# Patient Record
Sex: Female | Born: 1990 | Race: White | Hispanic: No | Marital: Married | Smoking: Never smoker
Health system: Southern US, Community
[De-identification: ages and names within clinical notes are randomized; demographics above are authoritative.]

## PROBLEM LIST (undated history)

## (undated) DIAGNOSIS — E669 Obesity, unspecified: Secondary | ICD-10-CM

## (undated) HISTORY — DX: Obesity, unspecified: E66.9

---
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
## 2011-10-17 LAB — OB RESULTS CONSOLE HIV ANTIBODY (ROUTINE TESTING): HIV: NONREACTIVE

## 2011-10-17 LAB — OB RESULTS CONSOLE GC/CHLAMYDIA: Chlamydia: NEGATIVE

## 2011-10-17 LAB — OB RESULTS CONSOLE HEPATITIS B SURFACE ANTIGEN: Hepatitis B Surface Ag: NEGATIVE

## 2011-10-17 LAB — OB RESULTS CONSOLE RPR: RPR: NONREACTIVE

## 2012-05-01 ENCOUNTER — Inpatient Hospital Stay (HOSPITAL_COMMUNITY): Admission: AD | Admit: 2012-05-01 | Payer: Self-pay | Source: Ambulatory Visit | Admitting: Obstetrics & Gynecology

## 2012-05-07 ENCOUNTER — Inpatient Hospital Stay (HOSPITAL_COMMUNITY)
Admission: AD | Admit: 2012-05-07 | Discharge: 2012-05-10 | DRG: 373 | Disposition: A | Discharge status: Home / Self Care | Payer: BC Managed Care – PPO | Source: Ambulatory Visit | Attending: Obstetrics and Gynecology | Admitting: Obstetrics and Gynecology

## 2012-05-07 DIAGNOSIS — O34219 Maternal care for unspecified type scar from previous cesarean delivery: Principal | ICD-10-CM | POA: Diagnosis present

## 2012-05-07 NOTE — L&D Delivery Note (Signed)
Patient was C/C/+1 and pushed for aprrox 60 minutes with epidural.   NSVD  female infant, Apgars 8/9, weight 7#13.   The patient had bilateral vaginal wall mucosal  lacerations repaired with 3-0 vicryl. Fundus was firm. EBL was expected. Placenta was delivered intact. Vagina was clear.  Baby was vigorous to bedside.  Philip Aspen

## 2012-05-08 ENCOUNTER — Encounter (HOSPITAL_COMMUNITY): Payer: Self-pay | Admitting: *Deleted

## 2012-05-08 ENCOUNTER — Inpatient Hospital Stay (HOSPITAL_COMMUNITY): Payer: BC Managed Care – PPO | Admitting: Anesthesiology

## 2012-05-08 ENCOUNTER — Inpatient Hospital Stay (HOSPITAL_COMMUNITY): Admission: RE | Admit: 2012-05-08 | Payer: 59 | Source: Ambulatory Visit

## 2012-05-08 ENCOUNTER — Encounter (HOSPITAL_COMMUNITY): Payer: Self-pay | Admitting: Anesthesiology

## 2012-05-08 LAB — RPR: RPR Ser Ql: NONREACTIVE

## 2012-05-08 LAB — CBC
HCT: 36.1 % (ref 36.0–46.0)
Hemoglobin: 12.4 g/dL (ref 12.0–15.0)
MCH: 30.6 pg (ref 26.0–34.0)
RBC: 4.05 MIL/uL (ref 3.87–5.11)

## 2012-05-08 LAB — ABO/RH: ABO/RH(D): A POS

## 2012-05-08 MED ORDER — LACTATED RINGERS IV SOLN: 500.0000 mL | INTRAVENOUS | Status: DC | PRN

## 2012-05-08 MED ORDER — DIPHENHYDRAMINE HCL 25 MG PO CAPS: 25.0000 mg | ORAL_CAPSULE | Freq: Four times a day (QID) | ORAL | Status: DC | PRN

## 2012-05-08 MED ORDER — EPHEDRINE 5 MG/ML INJ: 10.0000 mg | INTRAVENOUS | Status: DC | PRN

## 2012-05-08 MED ORDER — LANOLIN HYDROUS EX OINT: TOPICAL_OINTMENT | CUTANEOUS | Status: DC | PRN

## 2012-05-08 MED ORDER — PRENATAL MULTIVITAMIN CH
1.0000 | ORAL_TABLET | Freq: Every day | ORAL | Status: DC
  Filled 2012-05-08 (×2): qty 1

## 2012-05-08 MED ORDER — LIDOCAINE HCL (PF) 1 % IJ SOLN
INTRAMUSCULAR | Status: DC | PRN
  Administered 2012-05-08 (×2): 5 mL

## 2012-05-08 MED ORDER — OXYTOCIN 40 UNITS IN LACTATED RINGERS INFUSION - SIMPLE MED
1.0000 m[IU]/min | INTRAVENOUS | Status: DC
  Administered 2012-05-08: 2 m[IU]/min via INTRAVENOUS

## 2012-05-08 MED ORDER — EPHEDRINE 5 MG/ML INJ
10.0000 mg | INTRAVENOUS | Status: DC | PRN
  Filled 2012-05-08: qty 4

## 2012-05-08 MED ORDER — FENTANYL 2.5 MCG/ML BUPIVACAINE 1/10 % EPIDURAL INFUSION (WH - ANES)
14.0000 mL/h | INTRAMUSCULAR | Status: DC
  Administered 2012-05-08 (×2): 14 mL/h via EPIDURAL
  Filled 2012-05-08 (×2): qty 125

## 2012-05-08 MED ORDER — ONDANSETRON HCL 4 MG PO TABS: 4.0000 mg | ORAL_TABLET | ORAL | Status: DC | PRN

## 2012-05-08 MED ORDER — TERBUTALINE SULFATE 1 MG/ML IJ SOLN: 0.2500 mg | Freq: Once | INTRAMUSCULAR | Status: DC | PRN

## 2012-05-08 MED ORDER — PHENYLEPHRINE 40 MCG/ML (10ML) SYRINGE FOR IV PUSH (FOR BLOOD PRESSURE SUPPORT)
80.0000 ug | PREFILLED_SYRINGE | INTRAVENOUS | Status: DC | PRN
  Filled 2012-05-08: qty 5

## 2012-05-08 MED ORDER — IBUPROFEN 600 MG PO TABS: 600.0000 mg | ORAL_TABLET | Freq: Four times a day (QID) | ORAL | Status: DC | PRN

## 2012-05-08 MED ORDER — OXYTOCIN 40 UNITS IN LACTATED RINGERS INFUSION - SIMPLE MED
62.5000 mL/h | INTRAVENOUS | Status: DC
  Administered 2012-05-08: 62.5 mL/h via INTRAVENOUS
  Filled 2012-05-08: qty 1000

## 2012-05-08 MED ORDER — IBUPROFEN 600 MG PO TABS
600.0000 mg | ORAL_TABLET | Freq: Four times a day (QID) | ORAL | Status: DC
  Administered 2012-05-09 – 2012-05-10 (×7): 600 mg via ORAL
  Filled 2012-05-08 (×7): qty 1

## 2012-05-08 MED ORDER — LIDOCAINE HCL (PF) 1 % IJ SOLN: 30.0000 mL | INTRAMUSCULAR | Status: DC | PRN

## 2012-05-08 MED ORDER — ONDANSETRON HCL 4 MG/2ML IJ SOLN: 4.0000 mg | Freq: Four times a day (QID) | INTRAMUSCULAR | Status: DC | PRN

## 2012-05-08 MED ORDER — BENZOCAINE-MENTHOL 20-0.5 % EX AERO
1.0000 "application " | INHALATION_SPRAY | CUTANEOUS | Status: DC | PRN
  Filled 2012-05-08: qty 56

## 2012-05-08 MED ORDER — PHENYLEPHRINE 40 MCG/ML (10ML) SYRINGE FOR IV PUSH (FOR BLOOD PRESSURE SUPPORT): 80.0000 ug | PREFILLED_SYRINGE | INTRAVENOUS | Status: DC | PRN

## 2012-05-08 MED ORDER — WITCH HAZEL-GLYCERIN EX PADS: 1.0000 "application " | MEDICATED_PAD | CUTANEOUS | Status: DC | PRN

## 2012-05-08 MED ORDER — LACTATED RINGERS IV SOLN: 500.0000 mL | Freq: Once | INTRAVENOUS | Status: DC

## 2012-05-08 MED ORDER — SIMETHICONE 80 MG PO CHEW: 80.0000 mg | CHEWABLE_TABLET | ORAL | Status: DC | PRN

## 2012-05-08 MED ORDER — LACTATED RINGERS IV SOLN
INTRAVENOUS | Status: DC
  Administered 2012-05-08: 11:00:00 via INTRAVENOUS

## 2012-05-08 MED ORDER — OXYCODONE-ACETAMINOPHEN 5-325 MG PO TABS: 1.0000 | ORAL_TABLET | ORAL | Status: DC | PRN

## 2012-05-08 MED ORDER — FLEET ENEMA 7-19 GM/118ML RE ENEM: 1.0000 | ENEMA | RECTAL | Status: DC | PRN

## 2012-05-08 MED ORDER — OXYCODONE-ACETAMINOPHEN 5-325 MG PO TABS
1.0000 | ORAL_TABLET | ORAL | Status: DC | PRN
  Administered 2012-05-08 – 2012-05-09 (×3): 1 via ORAL
  Filled 2012-05-08 (×3): qty 1

## 2012-05-08 MED ORDER — DIBUCAINE 1 % RE OINT: 1.0000 "application " | TOPICAL_OINTMENT | RECTAL | Status: DC | PRN

## 2012-05-08 MED ORDER — ACETAMINOPHEN 325 MG PO TABS
650.0000 mg | ORAL_TABLET | ORAL | Status: DC | PRN
  Administered 2012-05-08: 650 mg via ORAL
  Filled 2012-05-08: qty 2

## 2012-05-08 MED ORDER — TETANUS-DIPHTH-ACELL PERTUSSIS 5-2.5-18.5 LF-MCG/0.5 IM SUSP: 0.5000 mL | Freq: Once | INTRAMUSCULAR | Status: DC

## 2012-05-08 MED ORDER — CITRIC ACID-SODIUM CITRATE 334-500 MG/5ML PO SOLN: 30.0000 mL | ORAL | Status: DC | PRN

## 2012-05-08 MED ORDER — SENNOSIDES-DOCUSATE SODIUM 8.6-50 MG PO TABS
2.0000 | ORAL_TABLET | Freq: Every day | ORAL | Status: DC
  Administered 2012-05-08: 2 via ORAL

## 2012-05-08 MED ORDER — OXYTOCIN BOLUS FROM INFUSION
500.0000 mL | INTRAVENOUS | Status: DC
  Administered 2012-05-08: 500 mL via INTRAVENOUS

## 2012-05-08 MED ORDER — ONDANSETRON HCL 4 MG/2ML IJ SOLN: 4.0000 mg | INTRAMUSCULAR | Status: DC | PRN

## 2012-05-08 MED ORDER — ZOLPIDEM TARTRATE 5 MG PO TABS: 5.0000 mg | ORAL_TABLET | Freq: Every evening | ORAL | Status: DC | PRN

## 2012-05-08 MED ORDER — DIPHENHYDRAMINE HCL 50 MG/ML IJ SOLN: 12.5000 mg | INTRAMUSCULAR | Status: DC | PRN

## 2012-05-08 NOTE — MAU Note (Signed)
ARRIVED-  BROUGHT TO RM 7 VIA W/C.  THEN ASKED IF COULD VOID.

## 2012-05-08 NOTE — Anesthesia Preprocedure Evaluation (Signed)
Anesthesia Evaluation  Patient identified by MRN, date of birth, ID band Patient awake    Reviewed: Allergy & Precautions, H&P , Patient's Chart, lab work & pertinent test results  Airway Mallampati: III TM Distance: >3 FB Neck ROM: full    Dental No notable dental hx.    Pulmonary neg pulmonary ROS,  breath sounds clear to auscultation  Pulmonary exam normal       Cardiovascular negative cardio ROS  Rhythm:regular Rate:Normal     Neuro/Psych negative neurological ROS  negative psych ROS   GI/Hepatic negative GI ROS, Neg liver ROS,   Endo/Other  negative endocrine ROS  Renal/GU negative Renal ROS     Musculoskeletal   Abdominal   Peds  Hematology negative hematology ROS (+)   Anesthesia Other Findings   Reproductive/Obstetrics (+) Pregnancy                           Anesthesia Physical Anesthesia Plan  ASA: III  Anesthesia Plan: Epidural   Post-op Pain Management:    Induction:   Airway Management Planned:   Additional Equipment:   Intra-op Plan:   Post-operative Plan:   Informed Consent: I have reviewed the patients History and Physical, chart, labs and discussed the procedure including the risks, benefits and alternatives for the proposed anesthesia with the patient or authorized representative who has indicated his/her understanding and acceptance.     Plan Discussed with:   Anesthesia Plan Comments:         Anesthesia Quick Evaluation  

## 2012-05-08 NOTE — MAU Note (Signed)
SAYS SHE  HURT BAD  SINCE 10PM. .  SAYS WAS 2 CM IN OFFICE YESTERDAY.  DENIES HSV AND MRSA.   C/S  WITH FIRST PREG- BREECH-   TRY TO VBAC THIS PREG

## 2012-05-08 NOTE — Progress Notes (Signed)
Dr Claiborne Billings at nurse's station and updated on decel, interventions and sve

## 2012-05-08 NOTE — H&P (Signed)
22 y.o. [redacted]w[redacted]d  G2P1001 comes in c/o ctx.  Otherwise has good fetal movement and no bleeding.  H/o c/s for breech, desires TOLAC, consent obtained after review of risks, benefits, complications.  History reviewed. No pertinent past medical history.  Past Surgical History  Procedure Date  . Cesarean section     OB History    Grav Para Term Preterm Abortions TAB SAB Ect Mult Living   2 1 1       1      # Outc Date GA Lbr Len/2nd Wgt Sex Del Anes PTL Lv   1 TRM            2 CUR               History   Social History  . Marital Status: Married    Spouse Name: N/A    Number of Children: N/A  . Years of Education: N/A   Occupational History  . Not on file.   Social History Main Topics  . Smoking status: Never Smoker   . Smokeless tobacco: Not on file  . Alcohol Use: No  . Drug Use: No  . Sexually Active:    Other Topics Concern  . Not on file   Social History Narrative  . No narrative on file   Latex and Penicillins    Prenatal Transfer Tool  Maternal Diabetes: No Genetic Screening: Declined Fetal Ultrasounds or other Referrals:  Other: nl anatomy scan Maternal Substance Abuse:  No Significant Maternal Medications:  None Significant Maternal Lab Results: None  Other PNC: none    Filed Vitals:   05/08/12 0931  BP: 82/56  Pulse: 119  Temp:   Resp: 16     Lungs/Cor:  NAD Abdomen:  soft, gravid Ex:  no cords, erythema SVE:  5.5/80/-1 - no change from prior exam FHTs:  145, good STV, NST R Toco:  q2-5   A/P   Pt admitted for labor, TOLAC AROM'd for clear fluid, will start pit, IUPC placed  GBS  Neg  Takasha Vetere

## 2012-05-08 NOTE — Anesthesia Procedure Notes (Signed)
Epidural Patient location during procedure: OB Start time: 05/08/2012 3:31 AM  Staffing Anesthesiologist: Angus Seller., Harrell Gave. Performed by: anesthesiologist   Preanesthetic Checklist Completed: patient identified, site marked, surgical consent, pre-op evaluation, timeout performed, IV checked, risks and benefits discussed and monitors and equipment checked  Epidural Patient position: sitting Prep: site prepped and draped and DuraPrep Patient monitoring: continuous pulse ox and blood pressure Approach: midline Injection technique: LOR air and LOR saline  Needle:  Needle type: Tuohy  Needle gauge: 17 G Needle length: 9 cm and 9 Needle insertion depth: 6 cm Catheter type: closed end flexible Catheter size: 19 Gauge Catheter at skin depth: 11 cm Test dose: negative  Assessment Events: blood not aspirated, injection not painful, no injection resistance, negative IV test and no paresthesia  Additional Notes Patient identified.  Risk benefits discussed including failed block, incomplete pain control, headache, nerve damage, paralysis, blood pressure changes, nausea, vomiting, reactions to medication both toxic or allergic, and postpartum back pain.  Patient expressed understanding and wished to proceed.  All questions were answered.  Sterile technique used throughout procedure and epidural site dressed with sterile barrier dressing. No paresthesia or other complications noted.The patient did not experience any signs of intravascular injection such as tinnitus or metallic taste in mouth nor signs of intrathecal spread such as rapid motor block. Please see nursing notes for vital signs.

## 2012-05-08 NOTE — Progress Notes (Signed)
Dr Claiborne Billings notified pt complete

## 2012-05-08 NOTE — Progress Notes (Signed)
Dr Claiborne Billings notified of variables lasting approx 60-80 seconds with pushing

## 2012-05-09 LAB — CBC
Hemoglobin: 10.4 g/dL — ABNORMAL LOW (ref 12.0–15.0)
MCH: 30.1 pg (ref 26.0–34.0)
MCHC: 33.3 g/dL (ref 30.0–36.0)
Platelets: 136 10*3/uL — ABNORMAL LOW (ref 150–400)
RBC: 3.45 MIL/uL — ABNORMAL LOW (ref 3.87–5.11)

## 2012-05-09 NOTE — Anesthesia Postprocedure Evaluation (Signed)
Anesthesia Post Note  Patient: [name]  Procedure(s) Performed: * No procedures listed *  Anesthesia type: Epidural  Patient location: Mother/Baby  Post pain: Pain level controlled  Post assessment: Post-op Vital signs reviewed  Last Vitals:  Filed Vitals:   05/09/12 0610  BP: 106/62  Pulse: 105  Temp: 36.6 C  Resp: 18    Post vital signs: Reviewed  Level of consciousness:alert  Complications: No apparent anesthesia complications

## 2012-05-09 NOTE — Addendum Note (Signed)
Addendum  created 05/09/12 1725 by Algis Greenhouse, CRNA   Modules edited:Anesthesia LDA

## 2012-05-09 NOTE — Progress Notes (Signed)
Pt is 22 yo and desires BTL.  I d/w the pt the high rate of regret in her age group for permanent sterilization and the options for reversible BC.  I d/w the pt that the best plan of action was to have her BTL at 6 weeks pp to make sure everything is ok with this baby and to give a little more time to consider.  She agreed.

## 2012-05-09 NOTE — Discharge Summary (Signed)
Obstetric Discharge Summary Reason for Admission: onset of labor Prenatal Procedures: none Intrapartum Procedures: spontaneous vaginal delivery, VBAC Postpartum Procedures: none Complications-Operative and Postpartum: 1 st degree vaginal lacerations Hemoglobin  Date Value Range Status  05/09/2012 10.4* 12.0 - 15.0 g/dL Final     HCT  Date Value Range Status  05/09/2012 31.2* 36.0 - 46.0 % Final    Discharge Diagnoses: Term Pregnancy-delivered  Discharge Information: Date: 05/09/2012 Activity: pelvic rest Diet: routine Medications: ibuprofen Condition: stable Instructions: refer to practice specific booklet Discharge to: home Follow-up Information    Follow up with CALLAHAN, SIDNEY, DO. Schedule an appointment as soon as possible for a visit in 4 weeks.   Contact information:   [address] [place] [address] [phone]          Newborn Data: Live born female  Birth Weight: 7 lb 13.8 oz (3566 g) APGAR: 8, 9  Home with mother.  Pleasant Britz A 05/09/2012, 7:56 AM

## 2012-05-09 NOTE — Progress Notes (Signed)
Patient is eating, ambulating, voiding.  Pain control is good.  Filed Vitals:   05/08/12 2036 05/08/12 2128 05/09/12 0046 05/09/12 0610  BP: 135/69 124/69 139/65 106/62  Pulse: 122 123 118 105  Temp: 98.3 F (36.8 C) 98 F (36.7 C) 98.1 F (36.7 C) 97.9 F (36.6 C)  TempSrc: Oral Oral Oral Oral  Resp: 16 18 18 18   Height:      Weight:      SpO2:   99%     Fundus firm Perineum without swelling.  Lab Results  Component Value Date   WBC 17.2* 05/09/2012   HGB 10.4* 05/09/2012   HCT 31.2* 05/09/2012   MCV 90.4 05/09/2012   PLT 136* 05/09/2012    --/--/A POS (01/02 0205)/RI  A/P Post partum day 1.  Routine care.  Expect d/c tomorrow.    Theresia Pree A

## 2012-05-10 LAB — TYPE AND SCREEN
Antibody Screen: NEGATIVE
Unit division: 0
Unit division: 0

## 2012-05-10 NOTE — Progress Notes (Signed)
Patient is eating, ambulating, voiding.  Pain control is good.  Filed Vitals:   05/09/12 0610 05/09/12 1415 05/09/12 2153 05/10/12 0625  BP: 106/62 115/67 107/68 116/57  Pulse: 105 98 96 83  Temp: 97.9 F (36.6 C) 98 F (36.7 C) 97.7 F (36.5 C) 97.2 F (36.2 C)  TempSrc: Oral Oral Oral Oral  Resp: 18 18 18 16   Height:      Weight:      SpO2:        Fundus firm Perineum without swelling.  Lab Results  Component Value Date   WBC 17.2* 05/09/2012   HGB 10.4* 05/09/2012   HCT 31.2* 05/09/2012   MCV 90.4 05/09/2012   PLT 136* 05/09/2012    --/--/A POS (01/02 0205)/RI  A/P Post partum day 2.  Routine care.  Expect d/c today.    Cedar Ditullio A

## 2012-06-22 ENCOUNTER — Observation Stay (HOSPITAL_COMMUNITY): Payer: BC Managed Care – PPO | Admitting: Anesthesiology

## 2012-06-22 ENCOUNTER — Observation Stay (HOSPITAL_COMMUNITY): Payer: BC Managed Care – PPO

## 2012-06-22 ENCOUNTER — Encounter (HOSPITAL_COMMUNITY): Payer: Self-pay

## 2012-06-22 ENCOUNTER — Encounter (HOSPITAL_COMMUNITY): Payer: Self-pay | Admitting: Anesthesiology

## 2012-06-22 ENCOUNTER — Inpatient Hospital Stay (HOSPITAL_COMMUNITY)
Admission: EM | Admit: 2012-06-22 | Discharge: 2012-06-24 | DRG: 494 | Disposition: A | Discharge status: Home / Self Care | Payer: BC Managed Care – PPO | Attending: Surgery | Admitting: Surgery

## 2012-06-22 ENCOUNTER — Encounter (HOSPITAL_COMMUNITY): Admission: EM | Disposition: A | Discharge status: Home / Self Care | Payer: Self-pay | Source: Home / Self Care

## 2012-06-22 ENCOUNTER — Emergency Department (HOSPITAL_COMMUNITY): Payer: BC Managed Care – PPO

## 2012-06-22 DIAGNOSIS — K8000 Calculus of gallbladder with acute cholecystitis without obstruction: Principal | ICD-10-CM | POA: Diagnosis present

## 2012-06-22 DIAGNOSIS — K8010 Calculus of gallbladder with chronic cholecystitis without obstruction: Secondary | ICD-10-CM

## 2012-06-22 DIAGNOSIS — K810 Acute cholecystitis: Secondary | ICD-10-CM | POA: Diagnosis present

## 2012-06-22 DIAGNOSIS — K8050 Calculus of bile duct without cholangitis or cholecystitis without obstruction: Secondary | ICD-10-CM

## 2012-06-22 DIAGNOSIS — Z880 Allergy status to penicillin: Secondary | ICD-10-CM

## 2012-06-22 DIAGNOSIS — Z91040 Latex allergy status: Secondary | ICD-10-CM

## 2012-06-22 HISTORY — PX: CHOLECYSTECTOMY: SHX55

## 2012-06-22 LAB — COMPREHENSIVE METABOLIC PANEL
ALT: 17 U/L (ref 0–35)
AST: 16 U/L (ref 0–37)
Albumin: 4.4 g/dL (ref 3.5–5.2)
Alkaline Phosphatase: 122 U/L — ABNORMAL HIGH (ref 39–117)
BUN: 12 mg/dL (ref 6–23)
Chloride: 101 mEq/L (ref 96–112)
Potassium: 3.5 mEq/L (ref 3.5–5.1)
Sodium: 139 mEq/L (ref 135–145)
Total Protein: 8.5 g/dL — ABNORMAL HIGH (ref 6.0–8.3)

## 2012-06-22 LAB — CBC WITH DIFFERENTIAL/PLATELET
Basophils Absolute: 0 10*3/uL (ref 0.0–0.1)
Basophils Relative: 0 % (ref 0–1)
Eosinophils Absolute: 0.1 10*3/uL (ref 0.0–0.7)
MCH: 30 pg (ref 26.0–34.0)
MCHC: 34.5 g/dL (ref 30.0–36.0)
Neutro Abs: 9.6 10*3/uL — ABNORMAL HIGH (ref 1.7–7.7)
Neutrophils Relative %: 73 % (ref 43–77)
Platelets: 238 10*3/uL (ref 150–400)
RDW: 12.3 % (ref 11.5–15.5)

## 2012-06-22 LAB — URINALYSIS, ROUTINE W REFLEX MICROSCOPIC
Glucose, UA: NEGATIVE mg/dL
Ketones, ur: NEGATIVE mg/dL
Nitrite: NEGATIVE
Protein, ur: NEGATIVE mg/dL

## 2012-06-22 LAB — MRSA PCR SCREENING: MRSA by PCR: NEGATIVE

## 2012-06-22 LAB — PREGNANCY, URINE: Preg Test, Ur: NEGATIVE

## 2012-06-22 LAB — LIPASE, BLOOD: Lipase: 25 U/L (ref 11–59)

## 2012-06-22 SURGERY — LAPAROSCOPIC CHOLECYSTECTOMY WITH INTRAOPERATIVE CHOLANGIOGRAM
Anesthesia: General | Site: Abdomen | Wound class: Clean Contaminated

## 2012-06-22 MED ORDER — ONDANSETRON HCL 4 MG/2ML IJ SOLN
INTRAMUSCULAR | Status: DC | PRN
  Administered 2012-06-22: 4 mg via INTRAVENOUS

## 2012-06-22 MED ORDER — FENTANYL CITRATE 0.05 MG/ML IJ SOLN
INTRAMUSCULAR | Status: AC
  Filled 2012-06-22: qty 2

## 2012-06-22 MED ORDER — LACTATED RINGERS IR SOLN
Status: DC | PRN
  Administered 2012-06-22: 1

## 2012-06-22 MED ORDER — KCL IN DEXTROSE-NACL 20-5-0.9 MEQ/L-%-% IV SOLN
INTRAVENOUS | Status: DC
  Administered 2012-06-22 – 2012-06-23 (×3): via INTRAVENOUS
  Administered 2012-06-24: 100 mL/h via INTRAVENOUS
  Filled 2012-06-22 (×5): qty 1000

## 2012-06-22 MED ORDER — KCL IN DEXTROSE-NACL 20-5-0.9 MEQ/L-%-% IV SOLN
INTRAVENOUS | Status: DC
  Administered 2012-06-22: 13:00:00 via INTRAVENOUS
  Filled 2012-06-22 (×2): qty 1000

## 2012-06-22 MED ORDER — MORPHINE SULFATE 4 MG/ML IJ SOLN
4.0000 mg | Freq: Once | INTRAMUSCULAR | Status: AC
  Administered 2012-06-22: 4 mg via INTRAVENOUS
  Filled 2012-06-22: qty 1

## 2012-06-22 MED ORDER — ONDANSETRON HCL 4 MG/2ML IJ SOLN
4.0000 mg | Freq: Once | INTRAMUSCULAR | Status: AC
  Administered 2012-06-22: 4 mg via INTRAVENOUS
  Filled 2012-06-22: qty 2

## 2012-06-22 MED ORDER — ROCURONIUM BROMIDE 100 MG/10ML IV SOLN
INTRAVENOUS | Status: DC | PRN
  Administered 2012-06-22: 20 mg via INTRAVENOUS

## 2012-06-22 MED ORDER — LIDOCAINE HCL (CARDIAC) 20 MG/ML IV SOLN
INTRAVENOUS | Status: DC | PRN
  Administered 2012-06-22: 100 mg via INTRAVENOUS

## 2012-06-22 MED ORDER — IOHEXOL 300 MG/ML  SOLN
INTRAMUSCULAR | Status: AC
  Filled 2012-06-22: qty 1

## 2012-06-22 MED ORDER — GLYCOPYRROLATE 0.2 MG/ML IJ SOLN
INTRAMUSCULAR | Status: DC | PRN
  Administered 2012-06-22: 0.4 mg via INTRAVENOUS

## 2012-06-22 MED ORDER — LACTATED RINGERS IV SOLN
INTRAVENOUS | Status: DC | PRN
  Administered 2012-06-22 (×2): via INTRAVENOUS

## 2012-06-22 MED ORDER — PROPOFOL 10 MG/ML IV BOLUS
INTRAVENOUS | Status: DC | PRN
  Administered 2012-06-22: 150 mg via INTRAVENOUS

## 2012-06-22 MED ORDER — MORPHINE SULFATE 2 MG/ML IJ SOLN
2.0000 mg | INTRAMUSCULAR | Status: DC | PRN
  Administered 2012-06-22: 2 mg via INTRAVENOUS
  Filled 2012-06-22: qty 1

## 2012-06-22 MED ORDER — LACTATED RINGERS IV SOLN: INTRAVENOUS | Status: DC

## 2012-06-22 MED ORDER — MIDAZOLAM HCL 5 MG/5ML IJ SOLN
INTRAMUSCULAR | Status: DC | PRN
  Administered 2012-06-22: 2 mg via INTRAVENOUS

## 2012-06-22 MED ORDER — BUPIVACAINE-EPINEPHRINE 0.25% -1:200000 IJ SOLN
INTRAMUSCULAR | Status: DC | PRN
  Administered 2012-06-22: 20 mL

## 2012-06-22 MED ORDER — SODIUM CHLORIDE 0.9 % IV SOLN
Freq: Once | INTRAVENOUS | Status: AC
  Administered 2012-06-22: 07:00:00 via INTRAVENOUS

## 2012-06-22 MED ORDER — DEXAMETHASONE SODIUM PHOSPHATE 10 MG/ML IJ SOLN
INTRAMUSCULAR | Status: DC | PRN
  Administered 2012-06-22: 10 mg via INTRAVENOUS

## 2012-06-22 MED ORDER — FENTANYL CITRATE 0.05 MG/ML IJ SOLN
INTRAMUSCULAR | Status: DC | PRN
  Administered 2012-06-22 (×2): 50 ug via INTRAVENOUS

## 2012-06-22 MED ORDER — CIPROFLOXACIN IN D5W 400 MG/200ML IV SOLN
400.0000 mg | Freq: Two times a day (BID) | INTRAVENOUS | Status: DC
Start: 2012-06-22 — End: 2012-06-24
  Administered 2012-06-22 – 2012-06-24 (×4): 400 mg via INTRAVENOUS
  Filled 2012-06-22 (×5): qty 200

## 2012-06-22 MED ORDER — IOHEXOL 300 MG/ML  SOLN
INTRAMUSCULAR | Status: DC | PRN
  Administered 2012-06-22: 20 mL via INTRAVENOUS

## 2012-06-22 MED ORDER — ONDANSETRON HCL 4 MG/2ML IJ SOLN: 4.0000 mg | Freq: Four times a day (QID) | INTRAMUSCULAR | Status: DC | PRN

## 2012-06-22 MED ORDER — SUCCINYLCHOLINE CHLORIDE 20 MG/ML IJ SOLN
INTRAMUSCULAR | Status: DC | PRN
  Administered 2012-06-22: 100 mg via INTRAVENOUS

## 2012-06-22 MED ORDER — FENTANYL CITRATE 0.05 MG/ML IJ SOLN
25.0000 ug | INTRAMUSCULAR | Status: DC | PRN
  Administered 2012-06-22 (×3): 50 ug via INTRAVENOUS

## 2012-06-22 MED ORDER — BUPIVACAINE-EPINEPHRINE 0.25% -1:200000 IJ SOLN
INTRAMUSCULAR | Status: AC
  Filled 2012-06-22: qty 1

## 2012-06-22 MED ORDER — MORPHINE SULFATE 2 MG/ML IJ SOLN
2.0000 mg | INTRAMUSCULAR | Status: DC | PRN
  Administered 2012-06-22 – 2012-06-23 (×5): 2 mg via INTRAVENOUS
  Administered 2012-06-23: 4 mg via INTRAVENOUS
  Filled 2012-06-22 (×4): qty 1
  Filled 2012-06-22: qty 2
  Filled 2012-06-22: qty 1

## 2012-06-22 MED ORDER — NEOSTIGMINE METHYLSULFATE 1 MG/ML IJ SOLN
INTRAMUSCULAR | Status: DC | PRN
  Administered 2012-06-22: 3 mg via INTRAVENOUS

## 2012-06-22 SURGICAL SUPPLY — 48 items
APPLIER CLIP ROT 10 11.4 M/L (STAPLE) ×2
BENZOIN TINCTURE PRP APPL 2/3 (GAUZE/BANDAGES/DRESSINGS) IMPLANT
CANISTER SUCTION 2500CC (MISCELLANEOUS) ×2 IMPLANT
CATH REDDICK CHOLANGI 4FR 50CM (CATHETERS) ×2 IMPLANT
CHLORAPREP W/TINT 26ML (MISCELLANEOUS) ×2 IMPLANT
CLIP APPLIE ROT 10 11.4 M/L (STAPLE) ×1 IMPLANT
CLOTH BEACON ORANGE TIMEOUT ST (SAFETY) ×2 IMPLANT
COVER MAYO STAND STRL (DRAPES) ×2 IMPLANT
DECANTER SPIKE VIAL GLASS SM (MISCELLANEOUS) ×2 IMPLANT
DERMABOND ADVANCED (GAUZE/BANDAGES/DRESSINGS) ×2
DERMABOND ADVANCED .7 DNX12 (GAUZE/BANDAGES/DRESSINGS) ×2 IMPLANT
DRAIN CHANNEL RND F F (WOUND CARE) ×2 IMPLANT
DRAPE C-ARM 42X72 X-RAY (DRAPES) ×2 IMPLANT
DRAPE LAPAROSCOPIC ABDOMINAL (DRAPES) ×2 IMPLANT
ELECT REM PT RETURN 9FT ADLT (ELECTROSURGICAL) ×2
ELECTRODE REM PT RTRN 9FT ADLT (ELECTROSURGICAL) ×1 IMPLANT
ENDOLOOP SUT PDS II  0 18 (SUTURE) ×2
ENDOLOOP SUT PDS II 0 18 (SUTURE) ×2 IMPLANT
EVACUATOR SILICONE 100CC (DRAIN) ×2 IMPLANT
GLOVE BIOGEL PI IND STRL 7.0 (GLOVE) ×1 IMPLANT
GLOVE BIOGEL PI INDICATOR 7.0 (GLOVE) ×1
GLOVE SS BIOGEL STRL SZ 7.5 (GLOVE) ×1 IMPLANT
GLOVE SUPERSENSE BIOGEL SZ 7.5 (GLOVE) ×1
GOWN STRL NON-REIN LRG LVL3 (GOWN DISPOSABLE) ×2 IMPLANT
GOWN STRL REIN XL XLG (GOWN DISPOSABLE) ×4 IMPLANT
HEMOSTAT SURGICEL 4X8 (HEMOSTASIS) IMPLANT
IV CATH 14GX2 1/4 (CATHETERS) ×4 IMPLANT
IV SET EXT 30 76VOL 4 MALE LL (IV SETS) IMPLANT
KIT BASIN OR (CUSTOM PROCEDURE TRAY) ×2 IMPLANT
NS IRRIG 1000ML POUR BTL (IV SOLUTION) IMPLANT
POUCH SPECIMEN RETRIEVAL 10MM (ENDOMECHANICALS) ×2 IMPLANT
SET CHOLANGIOGRAPH MIX (MISCELLANEOUS) ×2 IMPLANT
SET IRRIG TUBING LAPAROSCOPIC (IRRIGATION / IRRIGATOR) ×2 IMPLANT
SOLUTION ANTI FOG 6CC (MISCELLANEOUS) ×2 IMPLANT
SPONGE GAUZE 4X4 12PLY (GAUZE/BANDAGES/DRESSINGS) ×2 IMPLANT
STOPCOCK K 69 2C6206 (IV SETS) IMPLANT
STRIP CLOSURE SKIN 1/2X4 (GAUZE/BANDAGES/DRESSINGS) IMPLANT
SUT ETHILON 3 0 PS 1 (SUTURE) ×2 IMPLANT
SUT MNCRL AB 4-0 PS2 18 (SUTURE) ×2 IMPLANT
TAPE CLOTH SURG 4X10 WHT LF (GAUZE/BANDAGES/DRESSINGS) ×2 IMPLANT
TOWEL OR 17X26 10 PK STRL BLUE (TOWEL DISPOSABLE) ×2 IMPLANT
TRAY LAP CHOLE (CUSTOM PROCEDURE TRAY) ×2 IMPLANT
TROCAR SLEEVE XCEL 5X75 (ENDOMECHANICALS) ×2 IMPLANT
TROCAR XCEL BLADELESS 5X75MML (TROCAR) ×2 IMPLANT
TROCAR XCEL BLUNT TIP 100MML (ENDOMECHANICALS) ×2 IMPLANT
TROCAR XCEL NON-BLD 11X100MML (ENDOMECHANICALS) ×2 IMPLANT
TROCAR Z-THREAD FIOS 5X100MM (TROCAR) ×2 IMPLANT
TUBING INSUFFLATION 10FT LAP (TUBING) ×2 IMPLANT

## 2012-06-22 NOTE — Transfer of Care (Signed)
Immediate Anesthesia Transfer of Care Note  Patient: [name]  Procedure(s) Performed: Procedure(s): LAPAROSCOPIC CHOLECYSTECTOMY WITH INTRAOPERATIVE CHOLANGIOGRAM (N/A)  Patient Location: PACU  Anesthesia Type:General  Level of Consciousness: sedated  Airway & Oxygen Therapy: Patient Spontanous Breathing and Patient connected to face mask oxygen  Post-op Assessment: Report given to PACU RN and Post -op Vital signs reviewed and stable  Post vital signs: Reviewed and stable  Complications: No apparent anesthesia complications

## 2012-06-22 NOTE — ED Provider Notes (Signed)
Medical screening examination/treatment/procedure(s) were performed by non-physician practitioner and as supervising physician I was immediately available for consultation/collaboration.  Olivia Mackie, MD 06/22/12 2114

## 2012-06-22 NOTE — ED Notes (Signed)
Pt had vaginal delivery 8 weeks ago also.

## 2012-06-22 NOTE — Anesthesia Preprocedure Evaluation (Addendum)

## 2012-06-22 NOTE — Op Note (Signed)
Preoperative Diagnosis: cholelithiasis and acute cholecystitis  Postoprative Diagnosis: same  Procedure: Procedure(s): LAPAROSCOPIC CHOLECYSTECTOMY WITH INTRAOPERATIVE CHOLANGIOGRAM   Surgeon: Glenna Fellows T   Assistants: None  Anesthesia:  General endotracheal anesthesiaDiagnos  Indications: patient is a 22 year old female 8 weeks postpartum who presents with several days of worsening and now constant right upper quadrant abdominal pain with nausea. Gallbladder ultrasound has shown multiple gallstones but no apparent gallbladder wall thickening. She has right upper quadrant tenderness and a moderately elevated white blood count. I have recommended proceeding with urgent laparoscopic cholecystectomy with cholangiogram for apparent acute cholecystitis. The indications for the procedure and its nature and risks have been discussed in detail elsewhere.    Procedure Detail: patient is brought to the operating room, placed in the supine position on the operating table, and general endotracheal anesthesia induced. She received preoperative broad-spectrum IV antibiotics. PAS were in place. The abdomen was widely sterilely prepped and draped. Patient timeout was performed and correct patient and procedure verified. Trocar sites were infiltrated with local anesthesia. A 1 cm incision was made at the umbilicus and dissection carried down to the midline fascia which was incised 1 cm and the peritoneum entered under direct vision. Mattress suture of 0 Vicryl the subcuticular was placed and pneumoperitoneum established. Under direct vision an 11 mm trocar was placed subxiphoid and 25 mm trochars along the right subcostal margin. The gallbladder was exposed and was tense and edematous and acutely inflamed. The gallbladder was decompressed with the aspiration needle and contained clear bile. The fundus was unable to be grasped and elevated up to the liver and the infundibulum grasped and retracted  inferolaterally. Peritoneum anterior to posterior to close triangle was incised and fibrofatty tissue was carefully dissected down off the neck of the gallbladder toward the porta hepatis. The cystic artery was clearly identified coursing up onto the gallbladder wall in close triangle of this was divided between 2 proximal and one distal clip. The distal gallbladder was thoroughly dissected. The cystic duct was identified which was edematous and thick. The cystic gallbladder junction was dissected 360 and Calot's triangle was thoroughly dissected until I had a good critical view of the liver through Calots triangle and the cystic duct gallbladder junction was thoroughly dissected 360. An operative cholangiogram was then obtained through the cystic duct. Initially the [facility] catheter was placed but there was reflux of contrast up through the cystic duct. I then used a Reddick catheter with a balloon tip as the cystic duct was very thickened edematous and difficult to occlude with a clip. The lumen was not actually that large. However again the contrast refluxed. I was able to pass the Reddick catheter more distally and inflated the balloon and obtained another x-ray which showed opacification of the common bile duct distally down into the duodenum which was normal size with no filling defects but it appeared that the Reddick catheter balloon was likely passed into the common bile duct occluding proximal flow. I attempted to deflate the balloon and pulled back somewhat and reinflate and eject but again contrast refluxed likely from a stone in the cystic duct near the common duct junction at this point I elected to abandon the cholangiogram. I felt comfortable the anatomy that was high on the cystic duct at the gallbladder junction. Due to the thickness I divided the cystic duct at the gallbladder junction and the cystic duct was closed with a PDS Endoloop which appeared very secure. The gallbladder was then dissected  free from its  bed using hook cautery, placed in Endo Catch bag and brought out through the umbilicus. The right upper quadrant was thoroughly irrigated and hemostasis assured. A 19 round Blake drain was left in Morison's pouch and brought out through one of the right lateral trocar sites. There was noticeable bleeding or trocar injury. All CO2 was evacuated and trochars removed and the mattress suture secured at the umbilicus. Skin incisions were closed with subcuticular Monocryl and Dermabond. Sponge needle and is to count were correct.    Findings: Acute cholecystitis  Estimated Blood Loss:  less than 50 mL         Drains: round 19 Blake drain in right upper quadrant  Blood Given: none          Specimens: bladder and contents        Complications:  * No complications entered in OR log *         Disposition: PACU - hemodynamically stable.         Condition: stable  Mariella Saa MD, FACS  06/22/2012, 5:28 PM

## 2012-06-22 NOTE — ED Provider Notes (Signed)
History     CSN: [id number]  Arrival date & time 06/22/12  0531   First MD Initiated Contact with Patient 06/22/12 [phone]      Chief Complaint  Patient presents with  . Flank Pain    (Consider location/radiation/quality/duration/timing/severity/associated sxs/prior treatment) HPI Stephanie Watson is a 22 y.o. female who presents to ED with complaint of right upper abdominal pain. States pain began about 12 hrs ago and has been constant since. States associated with nausea, two episodes of diarrhea. Pain worsened with movement, palpation, deep breaths. Hx of the same, but usually resolves on its own. Pt is 8 wks post partum. No vaginal complaints. No urinary symptoms. No fever, chills. Did not take anything for this problem.    No past medical history on file.  Past Surgical History  Procedure Laterality Date  . Cesarean section      No family history on file.  History  Substance Use Topics  . Smoking status: Never Smoker   . Smokeless tobacco: Not on file  . Alcohol Use: No    OB History   Grav Para Term Preterm Abortions TAB SAB Ect Mult Living   2 2 2       2       Review of Systems  Constitutional: Negative for fever and chills.  HENT: Negative for neck pain and neck stiffness.   Respiratory: Negative.   Cardiovascular: Negative.   Gastrointestinal: Positive for nausea, abdominal pain and diarrhea. Negative for vomiting.  Genitourinary: Positive for flank pain. Negative for dysuria, frequency, hematuria, decreased urine volume, vaginal bleeding, vaginal discharge and vaginal pain.  Musculoskeletal: Positive for back pain.  Skin: Negative.   Neurological: Negative for weakness and numbness.    Allergies  Latex and Penicillins  Home Medications  No current outpatient prescriptions on file.  BP 141/68  Pulse 84  Temp(Src) 98.3 F (36.8 C) (Oral)  Resp 20  SpO2 99%  Physical Exam  Nursing note and vitals reviewed. Constitutional: She appears  well-developed and well-nourished. No distress.  HENT:  Head: Normocephalic.  Eyes: Conjunctivae are normal.  Neck: Neck supple.  Cardiovascular: Normal rate, regular rhythm and normal heart sounds.   Pulmonary/Chest: Effort normal and breath sounds normal. No respiratory distress. She has no wheezes. She has no rales.  Abdominal: Soft. Bowel sounds are normal. She exhibits no distension. There is tenderness. There is guarding. There is no rebound.  RUQ and epigastric tenderness. Guarding. No CVA tenderness  Musculoskeletal: She exhibits no edema.  Neurological: She is alert.  Skin: Skin is warm and dry.    ED Course  Procedures (including critical care time)  RUQ pain, nausea. 1 month post partum. Suspect biliary colicky vs pyelonephritis. Will get labs, UA.   Results for orders placed during the hospital encounter of 06/22/12  URINALYSIS, ROUTINE W REFLEX MICROSCOPIC      Result Value Range   Color, Urine YELLOW  YELLOW   APPearance CLEAR  CLEAR   Specific Gravity, Urine 1.022  1.005 - 1.030   pH 5.5  5.0 - 8.0   Glucose, UA NEGATIVE  NEGATIVE mg/dL   Hgb urine dipstick NEGATIVE  NEGATIVE   Bilirubin Urine NEGATIVE  NEGATIVE   Ketones, ur NEGATIVE  NEGATIVE mg/dL   Protein, ur NEGATIVE  NEGATIVE mg/dL   Urobilinogen, UA 0.2  0.0 - 1.0 mg/dL   Nitrite NEGATIVE  NEGATIVE   Leukocytes, UA SMALL (*) NEGATIVE  PREGNANCY, URINE      Result Value Range  Preg Test, Ur NEGATIVE  NEGATIVE  CBC WITH DIFFERENTIAL      Result Value Range   WBC 13.2 (*) 4.0 - 10.5 K/uL   RBC 4.87  3.87 - 5.11 MIL/uL   Hemoglobin 14.6  12.0 - 15.0 g/dL   HCT 16.1  09.6 - 04.5 %   MCV 86.9  78.0 - 100.0 fL   MCH 30.0  26.0 - 34.0 pg   MCHC 34.5  30.0 - 36.0 g/dL   RDW 40.9  81.1 - 91.4 %   Platelets 238  150 - 400 K/uL   Neutrophils Relative 73  43 - 77 %   Neutro Abs 9.6 (*) 1.7 - 7.7 K/uL   Lymphocytes Relative 20  12 - 46 %   Lymphs Abs 2.7  0.7 - 4.0 K/uL   Monocytes Relative 6  3 - 12 %    Monocytes Absolute 0.8  0.1 - 1.0 K/uL   Eosinophils Relative 1  0 - 5 %   Eosinophils Absolute 0.1  0.0 - 0.7 K/uL   Basophils Relative 0  0 - 1 %   Basophils Absolute 0.0  0.0 - 0.1 K/uL  COMPREHENSIVE METABOLIC PANEL      Result Value Range   Sodium 139  135 - 145 mEq/L   Potassium 3.5  3.5 - 5.1 mEq/L   Chloride 101  96 - 112 mEq/L   CO2 24  19 - 32 mEq/L   Glucose, Bld 95  70 - 99 mg/dL   BUN 12  6 - 23 mg/dL   Creatinine, Ser 7.82  0.50 - 1.10 mg/dL   Calcium 95.6  8.4 - 21.3 mg/dL   Total Protein 8.5 (*) 6.0 - 8.3 g/dL   Albumin 4.4  3.5 - 5.2 g/dL   AST 16  0 - 37 U/L   ALT 17  0 - 35 U/L   Alkaline Phosphatase 122 (*) 39 - 117 U/L   Total Bilirubin 0.5  0.3 - 1.2 mg/dL   GFR calc non Af Amer >90  >90 mL/min   GFR calc Af Amer >90  >90 mL/min  LIPASE, BLOOD      Result Value Range   Lipase 25  11 - 59 U/L  URINE MICROSCOPIC-ADD ON      Result Value Range   WBC, UA 3-6  <3 WBC/hpf   No results found.  7:26 AM [place] ordered for evaluation of gallbaldder. No signs of kidney infection or kidney stone  US Abdomen Complete  06/22/2012  *RADIOLOGY REPORT*  Clinical Data:  Right upper quadrant pain, right flank pain.  COMPLETE ABDOMINAL ULTRASOUND  Comparison:  None.  Findings:  Gallbladder:  Small mobile gallstones within the gallbladder.  No wall thickening.  Negative sonographic Murphy's.  Common bile duct:   Normal caliber, 4 mm.  Liver:  Small calcifications in the liver, likely old granulomas disease.  Normal echotexture.  No biliary ductal dilatation.  IVC:  Appears normal.  Pancreas:  No focal abnormality seen.  Spleen:  Within normal limits in size and echotexture.  Right Kidney:   Normal in size and parenchymal echogenicity.  No evidence of mass or hydronephrosis.  Left Kidney:  Normal in size and parenchymal echogenicity.  No evidence of mass or hydronephrosis.  Abdominal aorta:  No aneurysm identified.  IMPRESSION: Cholelithiasis.  No sonographic evidence of acute  cholecystitis.   Original Report Authenticated By: Charlett Nose, M.D.     10:40 AM Pt's pain continues despite pain medications, however,  she is much more comfortable. Spoke with Dr. Johna Sheriff regarding US findings, will come see in ED.    No diagnosis found.    MDM  PT with RUQ pain, nausea. Pain consistent with biliary colic. Pt is 8 wks post partum. US shows cholelithiasis. Labs unremarkable other than elevated WBC. Pt's pain persistent. Not improved in ED. Spoke with surgery, will admit. Most likely surgical cholecystectomy.         Lottie Mussel, PA 06/22/12 1622

## 2012-06-22 NOTE — Anesthesia Postprocedure Evaluation (Signed)
  Anesthesia Post-op Note  Patient: Hospital doctor B Hitch  Procedure(s) Performed: Procedure(s) (LRB): LAPAROSCOPIC CHOLECYSTECTOMY WITH INTRAOPERATIVE CHOLANGIOGRAM (N/A)  Patient Location: PACU  Anesthesia Type: General  Level of Consciousness: awake and alert   Airway and Oxygen Therapy: Patient Spontanous Breathing  Post-op Pain: mild  Post-op Assessment: Post-op Vital signs reviewed, Patient's Cardiovascular Status Stable, Respiratory Function Stable, Patent Airway and No signs of Nausea or vomiting  Last Vitals:  Filed Vitals:   06/22/12 1450  BP: 112/74  Pulse: 82  Temp: 37.1 C  Resp: 18    Post-op Vital Signs: stable   Complications: No apparent anesthesia complications

## 2012-06-22 NOTE — ED Notes (Signed)
Per pt, started having rt flank pain with nausea no vomiting at 4 pm yesterday.  No difficulty urinating.  No hx of same.  No fever.  Diarrhea x 2.

## 2012-06-22 NOTE — ED Notes (Signed)
At present Ultrasound in progress.

## 2012-06-22 NOTE — H&P (Signed)
Stephanie Watson is an 22 y.o. female.   Chief Complaint: abdominal pain HPI: patient is a 22 year old female approximately 8 weeks postpartum. For 2-3 weeks she has been having worsening intermittent right upper quadrant abdominal pain. She describes aching or pressure like pain under her right rib cage. This has been occurring in the late afternoon and evening and usually after eating. Yesterday however she developed more severe pain under her right rib cage and radiating to her back and chest. This has been associated with nausea but no vomiting. She presented to the emergency room. She has received IV fluids and pain and nausea medication and is feeling somewhat better but continues to have the pain. It has been ongoing now for approximately 24 hours. Her bowel movements are normal. No urinary symptoms. No previous history of significant GI or abdominal problems other than above.  No past medical history on file.  Past Surgical History  Procedure Laterality Date  . Cesarean section      No family history on file. Social History:  reports that she has never smoked. She does not have any smokeless tobacco history on file. She reports that she does not drink alcohol or use illicit drugs.  Allergies:  Allergies  Allergen Reactions  . Latex Swelling  . Penicillins Swelling   No current facility-administered medications for this encounter.   No current outpatient prescriptions on file.     Results for orders placed during the hospital encounter of 06/22/12 (from the past 48 hour(s))  URINALYSIS, ROUTINE W REFLEX MICROSCOPIC     Status: Abnormal   Collection Time    06/22/12  6:04 AM      Result Value Range   Color, Urine YELLOW  YELLOW   APPearance CLEAR  CLEAR   Specific Gravity, Urine 1.022  1.005 - 1.030   pH 5.5  5.0 - 8.0   Glucose, UA NEGATIVE  NEGATIVE mg/dL   Hgb urine dipstick NEGATIVE  NEGATIVE   Bilirubin Urine NEGATIVE  NEGATIVE   Ketones, ur NEGATIVE  NEGATIVE mg/dL   Protein, ur NEGATIVE  NEGATIVE mg/dL   Urobilinogen, UA 0.2  0.0 - 1.0 mg/dL   Nitrite NEGATIVE  NEGATIVE   Leukocytes, UA SMALL (*) NEGATIVE  PREGNANCY, URINE     Status: None   Collection Time    06/22/12  6:04 AM      Result Value Range   Preg Test, Ur NEGATIVE  NEGATIVE   Comment:            THE SENSITIVITY OF THIS     METHODOLOGY IS >20 mIU/mL.  URINE MICROSCOPIC-ADD ON     Status: None   Collection Time    06/22/12  6:04 AM      Result Value Range   WBC, UA 3-6  <3 WBC/hpf  CBC WITH DIFFERENTIAL     Status: Abnormal   Collection Time    06/22/12  6:30 AM      Result Value Range   WBC 13.2 (*) 4.0 - 10.5 K/uL   RBC 4.87  3.87 - 5.11 MIL/uL   Hemoglobin 14.6  12.0 - 15.0 g/dL   HCT 46.9  62.9 - 52.8 %   MCV 86.9  78.0 - 100.0 fL   MCH 30.0  26.0 - 34.0 pg   MCHC 34.5  30.0 - 36.0 g/dL   RDW 41.3  24.4 - 01.0 %   Platelets 238  150 - 400 K/uL   Neutrophils Relative 73  43 -  77 %   Neutro Abs 9.6 (*) 1.7 - 7.7 K/uL   Lymphocytes Relative 20  12 - 46 %   Lymphs Abs 2.7  0.7 - 4.0 K/uL   Monocytes Relative 6  3 - 12 %   Monocytes Absolute 0.8  0.1 - 1.0 K/uL   Eosinophils Relative 1  0 - 5 %   Eosinophils Absolute 0.1  0.0 - 0.7 K/uL   Basophils Relative 0  0 - 1 %   Basophils Absolute 0.0  0.0 - 0.1 K/uL  COMPREHENSIVE METABOLIC PANEL     Status: Abnormal   Collection Time    06/22/12  6:30 AM      Result Value Range   Sodium 139  135 - 145 mEq/L   Potassium 3.5  3.5 - 5.1 mEq/L   Chloride 101  96 - 112 mEq/L   CO2 24  19 - 32 mEq/L   Glucose, Bld 95  70 - 99 mg/dL   BUN 12  6 - 23 mg/dL   Creatinine, Ser 1.61  0.50 - 1.10 mg/dL   Calcium 09.6  8.4 - 04.5 mg/dL   Total Protein 8.5 (*) 6.0 - 8.3 g/dL   Albumin 4.4  3.5 - 5.2 g/dL   AST 16  0 - 37 U/L   ALT 17  0 - 35 U/L   Alkaline Phosphatase 122 (*) 39 - 117 U/L   Total Bilirubin 0.5  0.3 - 1.2 mg/dL   GFR calc non Af Amer >90  >90 mL/min   GFR calc Af Amer >90  >90 mL/min   Comment:            The eGFR  has been calculated     using the CKD EPI equation.     This calculation has not been     validated in all clinical     situations.     eGFR's persistently     <90 mL/min signify     possible Chronic Kidney Disease.  LIPASE, BLOOD     Status: None   Collection Time    06/22/12  6:30 AM      Result Value Range   Lipase 25  11 - 59 U/L   US Abdomen Complete  06/22/2012  *RADIOLOGY REPORT*  Clinical Data:  Right upper quadrant pain, right flank pain.  COMPLETE ABDOMINAL ULTRASOUND  Comparison:  None.  Findings:  Gallbladder:  Small mobile gallstones within the gallbladder.  No wall thickening.  Negative sonographic Murphy's.  Common bile duct:   Normal caliber, 4 mm.  Liver:  Small calcifications in the liver, likely old granulomas disease.  Normal echotexture.  No biliary ductal dilatation.  IVC:  Appears normal.  Pancreas:  No focal abnormality seen.  Spleen:  Within normal limits in size and echotexture.  Right Kidney:   Normal in size and parenchymal echogenicity.  No evidence of mass or hydronephrosis.  Left Kidney:  Normal in size and parenchymal echogenicity.  No evidence of mass or hydronephrosis.  Abdominal aorta:  No aneurysm identified.  IMPRESSION: Cholelithiasis.  No sonographic evidence of acute cholecystitis.   Original Report Authenticated By: Charlett Nose, M.D.     Review of Systems  Constitutional: Negative for fever and chills.  Respiratory: Negative.   Cardiovascular: Negative.   Gastrointestinal: Positive for heartburn, nausea and abdominal pain. Negative for vomiting, diarrhea, constipation, blood in stool and melena.  Genitourinary: Negative.     Blood pressure 135/75, pulse 75, temperature 98.3 F (36.8  C), temperature source Oral, resp. rate 16, SpO2 98.00%, unknown if currently breastfeeding. Physical Exam  General: Alert, overweight but otherwise well-appearing Caucasian female, in no distress Skin: Warm and dry without rash or infection. HEENT: No palpable  masses or thyromegaly. Sclera nonicteric. Pupils equal round and reactive. Oropharynx clear. Lymph nodes: No cervical, supraclavicular, or inguinal nodes palpable. Lungs: Breath sounds clear and equal without increased work of breathing Cardiovascular: Regular rate and rhythm without murmur. No JVD or edema. Peripheral pulses intact. Abdomen: Nondistended. There is localized tenderness in the right upper quadrant with some guarding. Remainder of her abdomen is nontender. Well-healed Pfannenstiel incision.. No masses palpable. No organomegaly. No palpable hernias. Extremities: No edema or joint swelling or deformity. No chronic venous stasis changes. Neurologic: Alert and fully oriented.  Assessment/Plan Persistent right upper quadrant abdominal pain entirely consistent with ongoing biliary colic or early acute cholecystitis. This has persisted despite treatment in the emergency room and I have recommended admission for urgent laparoscopic cholecystectomy.I discussed the procedure in detail.   We discussed the risks and benefits of a laparoscopic cholecystectomy and possible cholangiogram including, but not limited to bleeding, infection, injury to surrounding structures such as the intestine or liver, bile leak, retained gallstones, need to convert to an open procedure, prolonged diarrhea, blood clots such as  DVT, common bile duct injury, anesthesia risks, and possible need for additional procedures.  The likelihood of improvement in symptoms and return to the patient's normal status is good. We discussed the typical post-operative recovery course.  Clanton Emanuelson T 06/22/2012, 10:46 AM

## 2012-06-23 ENCOUNTER — Encounter (HOSPITAL_COMMUNITY): Payer: Self-pay | Admitting: General Surgery

## 2012-06-23 LAB — CBC
HCT: 38.4 % (ref 36.0–46.0)
Hemoglobin: 12.8 g/dL (ref 12.0–15.0)
MCV: 89.7 fL (ref 78.0–100.0)
WBC: 9.8 10*3/uL (ref 4.0–10.5)

## 2012-06-23 LAB — COMPREHENSIVE METABOLIC PANEL
Alkaline Phosphatase: 128 U/L — ABNORMAL HIGH (ref 39–117)
BUN: 5 mg/dL — ABNORMAL LOW (ref 6–23)
Chloride: 108 mEq/L (ref 96–112)
GFR calc Af Amer: >90 mL/min (ref >90)
GFR calc non Af Amer: >90 mL/min (ref >90)
Glucose, Bld: 162 mg/dL — ABNORMAL HIGH (ref 70–99)
Potassium: 4.2 mEq/L (ref 3.5–5.1)
Total Bilirubin: 0.3 mg/dL (ref 0.3–1.2)

## 2012-06-23 MED ORDER — ENOXAPARIN SODIUM 40 MG/0.4ML ~~LOC~~ SOLN
40.0000 mg | Freq: Every day | SUBCUTANEOUS | Status: DC
  Administered 2012-06-23: 40 mg via SUBCUTANEOUS
  Filled 2012-06-23 (×2): qty 0.4

## 2012-06-23 MED ORDER — KETOROLAC TROMETHAMINE 15 MG/ML IJ SOLN
15.0000 mg | Freq: Four times a day (QID) | INTRAMUSCULAR | Status: DC | PRN
  Administered 2012-06-23: 15 mg via INTRAVENOUS
  Filled 2012-06-23: qty 1

## 2012-06-23 MED ORDER — OXYCODONE-ACETAMINOPHEN 5-325 MG PO TABS
1.0000 | ORAL_TABLET | ORAL | Status: DC | PRN
  Administered 2012-06-23 – 2012-06-24 (×4): 2 via ORAL
  Filled 2012-06-23: qty 1
  Filled 2012-06-23: qty 2
  Filled 2012-06-23: qty 1
  Filled 2012-06-23 (×2): qty 2

## 2012-06-23 NOTE — Progress Notes (Signed)
1 Day Post-Op  Subjective: Still pretty uncomfortable, almost tearful.  Tolerating clears pretty well.  No flatus  Objective: Vital signs in last 24 hours: Temp:  [97.6 F (36.4 C)-99.5 F (37.5 C)] 98.6 F (37 C) (02/17 0545) Pulse Rate:  [63-111] 109 (02/17 0545) Resp:  [14-22] 18 (02/17 0545) BP: (112-144)/(63-82) 138/71 mmHg (02/17 0545) SpO2:  [98 %-100 %] 99 % (02/17 0545) Weight:  [165 lb (74.844 kg)] 165 lb (74.844 kg) (02/16 2028) Last BM Date: 06/21/12 Diet: clears, afebrile Tm 99.5, labs OK  Intake/Output from previous day: 02/16 0701 - 02/17 0700 In: 4765.8 [P.O.:720; I.V.:3645.8; IV Piggyback:400] Out: 2975 [Urine:2800; Drains:175] Intake/Output this shift: Total I/O In: -  Out: 765 [Urine:750; Drains:15]  General appearance: alert, cooperative, no distress and looks like she feels miserable. GI: soft, tender, BS hypoactive, no flatus, incisions look good.  No flatus.  Lab Results:   Recent Labs  06/22/12 0630 06/23/12 0422  WBC 13.2* 9.8  HGB 14.6 12.8  HCT 42.3 38.4  PLT 238 245    BMET  Recent Labs  06/22/12 0630 06/23/12 0422  NA 139 140  K 3.5 4.2  CL 101 108  CO2 24 24  GLUCOSE 95 162*  BUN 12 5*  CREATININE 0.70 0.66  CALCIUM 10.0 9.3   PT/INR No results found for this basename: LABPROT, INR,  in the last 72 hours   Recent Labs Lab 06/22/12 0630 06/23/12 0422  AST 16 31  ALT 17 26  ALKPHOS 122* 128*  BILITOT 0.5 0.3  PROT 8.5* 7.1  ALBUMIN 4.4 3.4*     Lipase     Component Value Date/Time   LIPASE 25 06/22/2012 0630     Studies/Results: Dg Cholangiogram Operative  06/22/2012  *RADIOLOGY REPORT*  Clinical Data:   Gallstones.  INTRAOPERATIVE CHOLANGIOGRAM  Comparison: Ultrasound 06/22/2012  Findings: Cine images from intraoperative cholangiogram demonstrate contrast filling of the gallbladder with numerous filling defects compatible with gallstones.  There are filling defects within the cystic duct compatible with  cystic duct stones.  No contrast initially passes into the common bile duct.  Finding suspicious for cystic duct obstruction.  On subsequent imaging, there appears to be a small catheter which has been advanced into the distal cystic duct or common bile duct. The common bile duct is normal caliber and contrast passes into the small bowel.  IMPRESSION: Multiple filling defects within the cystic duct compatible with cystic duct stones.  These appear to be obstructing.  Common bile duct is normal caliber without evidence of common bile duct obstruction.  These images were submitted for radiologic interpretation only. Please see the procedural report for the amount of contrast and the fluoroscopy time utilized.   Original Report Authenticated By: Charlett Nose, M.D.    US Abdomen Complete  06/22/2012  *RADIOLOGY REPORT*  Clinical Data:  Right upper quadrant pain, right flank pain.  COMPLETE ABDOMINAL ULTRASOUND  Comparison:  None.  Findings:  Gallbladder:  Small mobile gallstones within the gallbladder.  No wall thickening.  Negative sonographic Murphy's.  Common bile duct:   Normal caliber, 4 mm.  Liver:  Small calcifications in the liver, likely old granulomas disease.  Normal echotexture.  No biliary ductal dilatation.  IVC:  Appears normal.  Pancreas:  No focal abnormality seen.  Spleen:  Within normal limits in size and echotexture.  Right Kidney:   Normal in size and parenchymal echogenicity.  No evidence of mass or hydronephrosis.  Left Kidney:  Normal in size and  parenchymal echogenicity.  No evidence of mass or hydronephrosis.  Abdominal aorta:  No aneurysm identified.  IMPRESSION: Cholelithiasis.  No sonographic evidence of acute cholecystitis.   Original Report Authenticated By: Charlett Nose, M.D.     Medications: . ciprofloxacin  400 mg Intravenous Q12H    Assessment/Plan Acute cholecystitis/cholelithiasis, s/p LAPAROSCOPIC CHOLECYSTECTOMY WITH INTRAOPERATIVE CHOLANGIOGRAM, and drain placement,  06/22/2012, Mariella Saa, MD   Plan:  Advance diet as tolerated, add lovenox, mobilize, continue cipro. Recheck CBC in the AM.       LOS: 1 day    Jann Ra 06/23/2012

## 2012-06-23 NOTE — Progress Notes (Signed)
ATTENDING ADDENDUM:  I personally reviewed patient's record, examined the patient, and formulated the following assessment and plan:  Will try some toradol for pain, advance diet, JP output ok, hopefully d/c in AM

## 2012-06-24 DIAGNOSIS — K810 Acute cholecystitis: Secondary | ICD-10-CM | POA: Diagnosis present

## 2012-06-24 LAB — CBC
Platelets: 185 10*3/uL (ref 150–400)
RDW: 12.7 % (ref 11.5–15.5)
WBC: 9.5 10*3/uL (ref 4.0–10.5)

## 2012-06-24 LAB — BASIC METABOLIC PANEL
BUN: 8 mg/dL (ref 6–23)
CO2: 28 mEq/L (ref 19–32)
Calcium: 8.4 mg/dL (ref 8.4–10.5)
Creatinine, Ser: 0.7 mg/dL (ref 0.50–1.10)
GFR calc non Af Amer: >90 mL/min (ref >90)
Glucose, Bld: 100 mg/dL — ABNORMAL HIGH (ref 70–99)

## 2012-06-24 MED ORDER — OXYCODONE-ACETAMINOPHEN 5-325 MG PO TABS
1.0000 | ORAL_TABLET | ORAL | Status: DC | PRN
Start: 2012-06-24 — End: 2012-07-01

## 2012-06-24 MED ORDER — ACETAMINOPHEN 325 MG PO TABS: ORAL_TABLET | ORAL | Status: DC

## 2012-06-24 MED ORDER — CIPROFLOXACIN HCL 500 MG PO TABS: 500.0000 mg | ORAL_TABLET | Freq: Two times a day (BID) | ORAL | Status: DC

## 2012-06-24 NOTE — Progress Notes (Signed)
2 Days Post-Op  Subjective: Feels better eating regular diet.  Walking without difficulty  Objective: Vital signs in last 24 hours: Temp:  [97.8 F (36.6 C)-98.8 F (37.1 C)] 97.8 F (36.6 C) (02/18 0630) Pulse Rate:  [70-102] 87 (02/18 0630) Resp:  [18-20] 18 (02/18 0630) BP: (92-116)/(62-79) 92/62 mmHg (02/18 0630) SpO2:  [98 %-99 %] 98 % (02/18 0630) Last BM Date: 06/21/12 720 PO recorded yesterday, Fat modiifed diet, 90 ml thru the drain. Afebrile, VSS, labs OK Intake/Output from previous day: 02/17 0701 - 02/18 0700 In: 2120 [P.O.:720; I.V.:1400] Out: 3340 [Urine:3250; Drains:90] Intake/Output this shift: Total I/O In: 1388.3 [P.O.:240; I.V.:1148.3] Out: -   General appearance: alert, cooperative and no distress GI: soft, not really tender, drainage is clear serous in color. Incisions look fine.  Drain removed and steri stirp applied.  Lab Results:   Recent Labs  06/23/12 0422 06/24/12 0413  WBC 9.8 9.5  HGB 12.8 10.7*  HCT 38.4 32.1*  PLT 245 185    BMET  Recent Labs  06/23/12 0422 06/24/12 0413  NA 140 141  K 4.2 3.5  CL 108 106  CO2 24 28  GLUCOSE 162* 100*  BUN 5* 8  CREATININE 0.66 0.70  CALCIUM 9.3 8.4   PT/INR No results found for this basename: LABPROT, INR,  in the last 72 hours   Recent Labs Lab 06/22/12 0630 06/23/12 0422  AST 16 31  ALT 17 26  ALKPHOS 122* 128*  BILITOT 0.5 0.3  PROT 8.5* 7.1  ALBUMIN 4.4 3.4*     Lipase     Component Value Date/Time   LIPASE 25 06/22/2012 0630     Studies/Results: Dg Cholangiogram Operative  06/22/2012  *RADIOLOGY REPORT*  Clinical Data:   Gallstones.  INTRAOPERATIVE CHOLANGIOGRAM  Comparison: Ultrasound 06/22/2012  Findings: Cine images from intraoperative cholangiogram demonstrate contrast filling of the gallbladder with numerous filling defects compatible with gallstones.  There are filling defects within the cystic duct compatible with cystic duct stones.  No contrast initially  passes into the common bile duct.  Finding suspicious for cystic duct obstruction.  On subsequent imaging, there appears to be a small catheter which has been advanced into the distal cystic duct or common bile duct. The common bile duct is normal caliber and contrast passes into the small bowel.  IMPRESSION: Multiple filling defects within the cystic duct compatible with cystic duct stones.  These appear to be obstructing.  Common bile duct is normal caliber without evidence of common bile duct obstruction.  These images were submitted for radiologic interpretation only. Please see the procedural report for the amount of contrast and the fluoroscopy time utilized.   Original Report Authenticated By: Charlett Nose, M.D.     Medications: . ciprofloxacin  400 mg Intravenous Q12H  . enoxaparin (LOVENOX) injection  40 mg Subcutaneous Daily    Assessment/Plan Acute cholecystitis/cholelithiasis, s/p LAPAROSCOPIC CHOLECYSTECTOMY WITH INTRAOPERATIVE CHOLANGIOGRAM, and drain placement, 06/22/2012, Mariella Saa, MD    Plan: Home today with 5 more days of Cipro, follow up at the office 2 weeks, Dow or Dr. Johna Sheriff.          LOS: 2 days    Murphy Duzan 06/24/2012

## 2012-06-24 NOTE — Discharge Summary (Signed)
Physician Discharge Summary  Patient ID: TYMIKA GRILLI MRN: [id number] DOB/AGE: Dec 12, 1990 21 y.o.  Admit date: 06/22/2012 Discharge date: 06/24/2012  Admission Diagnoses:Acute cholecystitis/cholelithiasis   Discharge Diagnoses: same Active Problems:   Acute cholecystitis s/p lap chole 06/20/2012   PROCEDURES: LAPAROSCOPIC CHOLECYSTECTOMY WITH INTRAOPERATIVE CHOLANGIOGRAM, and drain placement, 06/22/2012, Mariella Saa, MD    Hospital Course: patient is a 22 year old female approximately 8 weeks postpartum. For 2-3 weeks she has been having worsening intermittent right upper quadrant abdominal pain. She describes aching or pressure like pain under her right rib cage. This has been occurring in the late afternoon and evening and usually after eating. Yesterday however she developed more severe pain under her right rib cage and radiating to her back and chest. This has been associated with nausea but no vomiting. She presented to the emergency room. She has received IV fluids and pain and nausea medication and is feeling somewhat better but continues to have the pain. It has been ongoing now for approximately 24 hours. Her bowel movements are normal. No urinary symptoms. No previous history of significant GI or abdominal problems other than above.  She was taken to the OR by Dr. Johna Sheriff and underwent surgery.Gallbladder was edematous and inflamed.  A drain was left in place.  She did well with her diet advanced.  She had clear drainage from her JP.  WBC normalized, she was tolerating a low fat diet and discharged after drain removal on 06/24/12. Follow up in clinic 2 weeks.  Condition on d/c:  Improved    Disposition: 01-Home or Self Care     Medication List    TAKE these medications       acetaminophen 325 MG tablet  Commonly known as:  TYLENOL  Do not take more than 4000 mg of tylenol per 24 hour period.  Tylenol acetaminophen is in the percocet.     ciprofloxacin 500 MG  tablet  Commonly known as:  CIPRO  Take 1 tablet (500 mg total) by mouth 2 (two) times daily.     oxyCODONE-acetaminophen 5-325 MG per tablet  Commonly known as:  PERCOCET/ROXICET  Take 1-2 tablets by mouth every 4 (four) hours as needed.           Follow-up Information   Follow up with HOXWORTH,BENJAMIN T, MD. Schedule an appointment as soon as possible for a visit in 2 weeks. (Ask for the [facility] clinic or Dr. Johna Sheriff in 2 weeks.)    Contact information:   [address] [place] [address] [phone]       Signed: Sherrie George 06/24/2012, 10:25 AM

## 2012-06-24 NOTE — Progress Notes (Signed)
Discharge instructions reviewed with patient and husband, incisions are within normal limits, tolerating diet and oral pain medication, prescription given, patient to follow up with md Stanford Breed RN 06-24-2012 10:57am

## 2012-06-24 NOTE — Progress Notes (Signed)
ATTENDING ADDENDUM:  I personally reviewed patient's record, examined the patient, and formulated the following assessment and plan:  Doing well.  Pain better.   D/C home

## 2012-06-24 NOTE — Discharge Summary (Signed)
ATTENDING ADDENDUM:  I personally reviewed patient's record, examined the patient, and formulated the following assessment and plan:  Drain output minimal and serous.  Drain removed.  Pt ready for d/c home.

## 2012-07-01 ENCOUNTER — Telehealth (INDEPENDENT_AMBULATORY_CARE_PROVIDER_SITE_OTHER): Payer: Self-pay | Admitting: *Deleted

## 2012-07-01 ENCOUNTER — Encounter (HOSPITAL_COMMUNITY): Payer: Self-pay | Admitting: Emergency Medicine

## 2012-07-01 ENCOUNTER — Emergency Department (HOSPITAL_COMMUNITY): Payer: BC Managed Care – PPO

## 2012-07-01 ENCOUNTER — Inpatient Hospital Stay (HOSPITAL_COMMUNITY): Payer: BC Managed Care – PPO

## 2012-07-01 ENCOUNTER — Inpatient Hospital Stay (HOSPITAL_COMMUNITY)
Admission: EM | Admit: 2012-07-01 | Discharge: 2012-07-03 | DRG: 207 | Disposition: A | Discharge status: Home / Self Care | Payer: BC Managed Care – PPO | Attending: General Surgery | Admitting: General Surgery

## 2012-07-01 DIAGNOSIS — K9186 Retained cholelithiasis following cholecystectomy: Secondary | ICD-10-CM | POA: Diagnosis present

## 2012-07-01 DIAGNOSIS — K8051 Calculus of bile duct without cholangitis or cholecystitis with obstruction: Secondary | ICD-10-CM | POA: Diagnosis present

## 2012-07-01 DIAGNOSIS — K8050 Calculus of bile duct without cholangitis or cholecystitis without obstruction: Principal | ICD-10-CM | POA: Diagnosis present

## 2012-07-01 DIAGNOSIS — R17 Unspecified jaundice: Secondary | ICD-10-CM | POA: Diagnosis present

## 2012-07-01 DIAGNOSIS — Z880 Allergy status to penicillin: Secondary | ICD-10-CM

## 2012-07-01 DIAGNOSIS — Z91040 Latex allergy status: Secondary | ICD-10-CM

## 2012-07-01 DIAGNOSIS — Z79899 Other long term (current) drug therapy: Secondary | ICD-10-CM

## 2012-07-01 DIAGNOSIS — Z792 Long term (current) use of antibiotics: Secondary | ICD-10-CM

## 2012-07-01 DIAGNOSIS — Z9089 Acquired absence of other organs: Secondary | ICD-10-CM

## 2012-07-01 DIAGNOSIS — Y836 Removal of other organ (partial) (total) as the cause of abnormal reaction of the patient, or of later complication, without mention of misadventure at the time of the procedure: Secondary | ICD-10-CM | POA: Diagnosis present

## 2012-07-01 DIAGNOSIS — Y92009 Unspecified place in unspecified non-institutional (private) residence as the place of occurrence of the external cause: Secondary | ICD-10-CM

## 2012-07-01 DIAGNOSIS — K8309 Other cholangitis: Secondary | ICD-10-CM

## 2012-07-01 LAB — COMPREHENSIVE METABOLIC PANEL
ALT: 354 U/L — ABNORMAL HIGH (ref 0–35)
Albumin: 3.8 g/dL (ref 3.5–5.2)
Calcium: 10.3 mg/dL (ref 8.4–10.5)
GFR calc Af Amer: >90 mL/min (ref >90)
Glucose, Bld: 90 mg/dL (ref 70–99)
Sodium: 137 mEq/L (ref 135–145)
Total Protein: 8.6 g/dL — ABNORMAL HIGH (ref 6.0–8.3)

## 2012-07-01 LAB — CBC WITH DIFFERENTIAL/PLATELET
Basophils Relative: 0 % (ref 0–1)
Eosinophils Absolute: 0 10*3/uL (ref 0.0–0.7)
Eosinophils Relative: 0 % (ref 0–5)
Lymphs Abs: 0.9 10*3/uL (ref 0.7–4.0)
MCH: 30.3 pg (ref 26.0–34.0)
MCHC: 33.6 g/dL (ref 30.0–36.0)
MCV: 90.3 fL (ref 78.0–100.0)
Neutrophils Relative %: 85 % — ABNORMAL HIGH (ref 43–77)
Platelets: 218 10*3/uL (ref 150–400)
RDW: 13.6 % (ref 11.5–15.5)

## 2012-07-01 LAB — LACTIC ACID, PLASMA: Lactic Acid, Venous: 1.1 mmol/L (ref 0.5–2.2)

## 2012-07-01 MED ORDER — LACTATED RINGERS IV SOLN
INTRAVENOUS | Status: DC
  Administered 2012-07-01 – 2012-07-02 (×4): via INTRAVENOUS
  Administered 2012-07-02: 1000 mL via INTRAVENOUS

## 2012-07-01 MED ORDER — HYDROMORPHONE HCL PF 1 MG/ML IJ SOLN
1.0000 mg | Freq: Once | INTRAMUSCULAR | Status: AC
  Administered 2012-07-01: 1 mg via INTRAVENOUS
  Filled 2012-07-01: qty 1

## 2012-07-01 MED ORDER — CIPROFLOXACIN IN D5W 400 MG/200ML IV SOLN
400.0000 mg | Freq: Two times a day (BID) | INTRAVENOUS | Status: DC
  Administered 2012-07-01 – 2012-07-02 (×3): 400 mg via INTRAVENOUS
  Filled 2012-07-01 (×4): qty 200

## 2012-07-01 MED ORDER — SODIUM CHLORIDE 0.9 % IV BOLUS (SEPSIS)
1000.0000 mL | Freq: Once | INTRAVENOUS | Status: AC
  Administered 2012-07-01: 1000 mL via INTRAVENOUS

## 2012-07-01 MED ORDER — METRONIDAZOLE IN NACL 5-0.79 MG/ML-% IV SOLN
500.0000 mg | Freq: Three times a day (TID) | INTRAVENOUS | Status: DC
  Administered 2012-07-01 – 2012-07-03 (×5): 500 mg via INTRAVENOUS
  Filled 2012-07-01 (×7): qty 100

## 2012-07-01 MED ORDER — PANTOPRAZOLE SODIUM 40 MG IV SOLR
40.0000 mg | Freq: Every day | INTRAVENOUS | Status: DC
  Administered 2012-07-01 – 2012-07-02 (×2): 40 mg via INTRAVENOUS
  Filled 2012-07-01 (×3): qty 40

## 2012-07-01 MED ORDER — SODIUM CHLORIDE 0.9 % IV SOLN: Freq: Once | INTRAVENOUS | Status: DC

## 2012-07-01 MED ORDER — MORPHINE SULFATE 2 MG/ML IJ SOLN
2.0000 mg | INTRAMUSCULAR | Status: DC | PRN
  Administered 2012-07-01 – 2012-07-02 (×2): 4 mg via INTRAVENOUS
  Administered 2012-07-02 (×3): 6 mg via INTRAVENOUS
  Administered 2012-07-02 (×2): 4 mg via INTRAVENOUS
  Administered 2012-07-02: 6 mg via INTRAVENOUS
  Filled 2012-07-01: qty 2
  Filled 2012-07-01: qty 3
  Filled 2012-07-01: qty 2
  Filled 2012-07-01: qty 1
  Filled 2012-07-01: qty 3
  Filled 2012-07-01: qty 2
  Filled 2012-07-01 (×2): qty 3
  Filled 2012-07-01: qty 1

## 2012-07-01 MED ORDER — ONDANSETRON HCL 4 MG/2ML IJ SOLN
4.0000 mg | Freq: Once | INTRAMUSCULAR | Status: AC
  Administered 2012-07-01: 4 mg via INTRAVENOUS
  Filled 2012-07-01: qty 2

## 2012-07-01 MED ORDER — ONDANSETRON HCL 4 MG/2ML IJ SOLN
4.0000 mg | Freq: Four times a day (QID) | INTRAMUSCULAR | Status: DC | PRN
  Administered 2012-07-02: 4 mg via INTRAVENOUS
  Filled 2012-07-01: qty 2

## 2012-07-01 NOTE — ED Notes (Signed)
Per patient, has gallbladder surgery one week ago, since surgery increased pain and jaundice

## 2012-07-01 NOTE — H&P (Signed)
Stephanie Watson is an 22 y.o. female.   Chief Complaint: abdominal pain HPI: the patient is a 22 year old female who underwent an urgent laparoscopic cholecystectomy on February 14 for cholelithiasis and acute cholecystitis. At that time she was noted to have multiple stones in her cystic duct. Operative cholangiogram was difficult due to this but the catheter was advanced into the common bile duct and she had a normal distal bile duct although the proximal ducts were not well-visualized. The patient had essentially normal liver function tests except for minimally elevated alkaline phosphatase of about 128 both preoperatively and 2 days postoperatively at discharge. She felt gradually better until about 3 days ago and since then has had the gradual onset of increasing right upper quadrant abdominal pain and some nausea without vomiting. She's had a low-grade fever. This evening the pain became quite a bit worse described is worse than her gallbladder pain preoperatively. She came into the emergency room for evaluation. She has some nausea but no vomiting.  History reviewed. No pertinent past medical history.  Past Surgical History  Procedure Laterality Date  . Cesarean section    . Cholecystectomy N/A 06/22/2012    Procedure: LAPAROSCOPIC CHOLECYSTECTOMY WITH INTRAOPERATIVE CHOLANGIOGRAM;  Surgeon: Mariella Saa, MD;  Location: [facility];  Service: General;  Laterality: N/A;    Family History  Problem Relation Age of Onset  . Diabetes Mellitus II Mother   . Hyperlipidemia Father    Social History:  reports that she has never smoked. She has never used smokeless tobacco. She reports that she does not drink alcohol or use illicit drugs.  Allergies:  Allergies  Allergen Reactions  . Latex Swelling  . Penicillins Swelling    Current Facility-Administered Medications  Medication Dose Route Frequency Provider Last Rate Last Dose  . 0.9 %  sodium chloride infusion   Intravenous Once Derwood Kaplan, MD       Current Outpatient Prescriptions  Medication Sig Dispense Refill  . acetaminophen (TYLENOL) 325 MG tablet Take 650 mg by mouth every 6 (six) hours as needed for pain.      . ciprofloxacin (CIPRO) 500 MG tablet Take 1 tablet (500 mg total) by mouth 2 (two) times daily.  10 tablet  0  . oxyCODONE-acetaminophen (PERCOCET/ROXICET) 5-325 MG per tablet Take 1-2 tablets by mouth every 4 (four) hours as needed for pain.       jh  Results for orders placed during the hospital encounter of 07/01/12 (from the past 48 hour(s))  CBC WITH DIFFERENTIAL     Status: Abnormal   Collection Time    07/01/12  7:16 PM      Result Value Range   WBC 12.6 (*) 4.0 - 10.5 K/uL   RBC 4.72  3.87 - 5.11 MIL/uL   Hemoglobin 14.3  12.0 - 15.0 g/dL   HCT 08.6  57.8 - 46.9 %   MCV 90.3  78.0 - 100.0 fL   MCH 30.3  26.0 - 34.0 pg   MCHC 33.6  30.0 - 36.0 g/dL   RDW 62.9  52.8 - 41.3 %   Platelets 218  150 - 400 K/uL   Neutrophils Relative 85 (*) 43 - 77 %   Neutro Abs 10.7 (*) 1.7 - 7.7 K/uL   Lymphocytes Relative 7 (*) 12 - 46 %   Lymphs Abs 0.9  0.7 - 4.0 K/uL   Monocytes Relative 8  3 - 12 %   Monocytes Absolute 1.0  0.1 - 1.0 K/uL  Eosinophils Relative 0  0 - 5 %   Eosinophils Absolute 0.0  0.0 - 0.7 K/uL   Basophils Relative 0  0 - 1 %   Basophils Absolute 0.0  0.0 - 0.1 K/uL  COMPREHENSIVE METABOLIC PANEL     Status: Abnormal   Collection Time    07/01/12  7:16 PM      Result Value Range   Sodium 137  135 - 145 mEq/L   Potassium 3.7  3.5 - 5.1 mEq/L   Chloride 97  96 - 112 mEq/L   CO2 26  19 - 32 mEq/L   Glucose, Bld 90  70 - 99 mg/dL   BUN 8  6 - 23 mg/dL   Creatinine, Ser 1.61  0.50 - 1.10 mg/dL   Calcium 09.6  8.4 - 04.5 mg/dL   Total Protein 8.6 (*) 6.0 - 8.3 g/dL   Albumin 3.8  3.5 - 5.2 g/dL   AST 409 (*) 0 - 37 U/L   ALT 354 (*) 0 - 35 U/L   Alkaline Phosphatase 1576 (*) 39 - 117 U/L   Total Bilirubin 8.5 (*) 0.3 - 1.2 mg/dL   GFR calc non Af Amer >90  >90 mL/min    GFR calc Af Amer >90  >90 mL/min   Comment:            The eGFR has been calculated     using the CKD EPI equation.     This calculation has not been     validated in all clinical     situations.     eGFR's persistently     <90 mL/min signify     possible Chronic Kidney Disease.  LACTIC ACID, PLASMA     Status: None   Collection Time    07/01/12  8:49 PM      Result Value Range   Lactic Acid, Venous 1.1  0.5 - 2.2 mmol/L   No results found.  Review of Systems  Constitutional: Positive for fever and malaise/fatigue. Negative for chills.  Respiratory: Positive for shortness of breath. Negative for cough and wheezing.   Cardiovascular: Negative.   Gastrointestinal: Positive for nausea and abdominal pain. Negative for vomiting.    Blood pressure 139/86, pulse 126, temperature 98.8 F (37.1 C), temperature source Oral, resp. rate 24, SpO2 95.00%, not currently breastfeeding. Physical Exam  General: Mildly overweight Caucasian female, jaundice, alert and appears mildly ill Skin: Warm and dry without rash or infection HEENT: Positive scleral icterus. Oropharynx clear with somewhat dry mucous membranes no palpable mass or thyromegaly. Lungs: Clear equal breath sounds without increased work of breathing Cardiac: Regular tachycardia. No murmurs or edema. Abdomen: Well-healed incisions. Nondistended. Abdomen is generally soft and nontender. There is well localized right upper quadrant tenderness with some guarding but no peritoneal signs. No masses or organomegaly. Extremities: No edema or joint swelling Neurologic: She is alert and fully oriented. No gross motor deficits.   Assessment/Plan 22 year old female 10 days following laparoscopic cholecystectomy who presents with 3 days of worsening right upper quadrant abdominal pain and jaundice. Cholangiogram as above was not of ideal quality and she did have multiple stones in her cystic duct. I strongly suspect she has a common bile duct  stone. Bile duct injury would be a possibility however her LFTs were normal 2 days postoperatively and she felt well for about one week postop. Bile leak seems unlikely with her localized tenderness in the degree of elevated LFTs. ERCP would be the definitive study  to sort all this out. She will be admitted and made n.p.o. and placed on IV antibiotics. I have discussed her case with Dr. Leone Payor from GI and they will see the patient for probable ERCP. This was all discussed with the patient and her mother.  Kerilyn Cortner T 07/01/2012, 9:22 PM

## 2012-07-01 NOTE — Telephone Encounter (Signed)
Patient called to states discomfort and burning at site of incision.  Patient denies any drainage or fevers at this time.  Instructed patient to try ice to the site to help with inflammation and swelling.  Patient instructed to try this for 24-48 hours and call us back if there is no relief.  Patient states understanding and agreeable at this time.

## 2012-07-02 ENCOUNTER — Encounter (HOSPITAL_COMMUNITY): Payer: Self-pay | Admitting: Anesthesiology

## 2012-07-02 ENCOUNTER — Encounter (HOSPITAL_COMMUNITY): Admission: EM | Disposition: A | Discharge status: Home / Self Care | Payer: Self-pay | Source: Home / Self Care

## 2012-07-02 ENCOUNTER — Inpatient Hospital Stay (HOSPITAL_COMMUNITY): Payer: BC Managed Care – PPO | Admitting: Anesthesiology

## 2012-07-02 ENCOUNTER — Inpatient Hospital Stay (HOSPITAL_COMMUNITY): Payer: BC Managed Care – PPO

## 2012-07-02 ENCOUNTER — Encounter (HOSPITAL_COMMUNITY): Payer: Self-pay | Admitting: Gastroenterology

## 2012-07-02 DIAGNOSIS — R17 Unspecified jaundice: Secondary | ICD-10-CM

## 2012-07-02 DIAGNOSIS — K8051 Calculus of bile duct without cholangitis or cholecystitis with obstruction: Secondary | ICD-10-CM | POA: Diagnosis present

## 2012-07-02 DIAGNOSIS — K8050 Calculus of bile duct without cholangitis or cholecystitis without obstruction: Principal | ICD-10-CM

## 2012-07-02 DIAGNOSIS — K8309 Other cholangitis: Secondary | ICD-10-CM

## 2012-07-02 HISTORY — PX: ERCP: SHX5425

## 2012-07-02 LAB — APTT: aPTT: 37 seconds (ref 24–37)

## 2012-07-02 LAB — CBC
MCH: 30.5 pg (ref 26.0–34.0)
MCHC: 33.2 g/dL (ref 30.0–36.0)
MCV: 91.8 fL (ref 78.0–100.0)
Platelets: 195 10*3/uL (ref 150–400)
RBC: 4.29 MIL/uL (ref 3.87–5.11)
RDW: 13.8 % (ref 11.5–15.5)

## 2012-07-02 LAB — PROTIME-INR: INR: 0.98 (ref 0.00–1.49)

## 2012-07-02 LAB — COMPREHENSIVE METABOLIC PANEL
AST: 158 U/L — ABNORMAL HIGH (ref 0–37)
CO2: 25 mEq/L (ref 19–32)
Calcium: 9.2 mg/dL (ref 8.4–10.5)
Creatinine, Ser: 0.54 mg/dL (ref 0.50–1.10)
GFR calc non Af Amer: >90 mL/min (ref >90)
Total Protein: 6.8 g/dL (ref 6.0–8.3)

## 2012-07-02 SURGERY — ERCP, WITH INTERVENTION IF INDICATED
Anesthesia: Monitor Anesthesia Care

## 2012-07-02 SURGERY — ENDOSCOPIC RETROGRADE CHOLANGIOPANCREATOGRAPHY (ERCP)
Anesthesia: General | Site: Abdomen

## 2012-07-02 MED ORDER — ROCURONIUM BROMIDE 100 MG/10ML IV SOLN
INTRAVENOUS | Status: DC | PRN
  Administered 2012-07-02: 5 mg via INTRAVENOUS
  Administered 2012-07-02: 25 mg via INTRAVENOUS

## 2012-07-02 MED ORDER — SUCCINYLCHOLINE CHLORIDE 20 MG/ML IJ SOLN
INTRAMUSCULAR | Status: DC | PRN
  Administered 2012-07-02: 100 mg via INTRAVENOUS

## 2012-07-02 MED ORDER — FENTANYL CITRATE 0.05 MG/ML IJ SOLN
50.0000 ug | INTRAMUSCULAR | Status: DC | PRN
  Administered 2012-07-02: 100 ug via INTRAVENOUS

## 2012-07-02 MED ORDER — PROPOFOL 10 MG/ML IV BOLUS
INTRAVENOUS | Status: DC | PRN
  Administered 2012-07-02: 180 mg via INTRAVENOUS

## 2012-07-02 MED ORDER — LIDOCAINE HCL (CARDIAC) 20 MG/ML IV SOLN
INTRAVENOUS | Status: DC | PRN
  Administered 2012-07-02: 50 mg via INTRAVENOUS

## 2012-07-02 MED ORDER — MIDAZOLAM HCL 5 MG/5ML IJ SOLN
INTRAMUSCULAR | Status: DC | PRN
  Administered 2012-07-02: 2 mg via INTRAVENOUS

## 2012-07-02 MED ORDER — ONDANSETRON HCL 4 MG/2ML IJ SOLN
INTRAMUSCULAR | Status: DC | PRN
  Administered 2012-07-02: 4 mg via INTRAVENOUS

## 2012-07-02 MED ORDER — FENTANYL CITRATE 0.05 MG/ML IJ SOLN
INTRAMUSCULAR | Status: DC | PRN
  Administered 2012-07-02 (×3): 50 ug via INTRAVENOUS
  Administered 2012-07-02: 100 ug via INTRAVENOUS

## 2012-07-02 MED ORDER — NEOSTIGMINE METHYLSULFATE 1 MG/ML IJ SOLN
INTRAMUSCULAR | Status: DC | PRN
  Administered 2012-07-02: 4 mg via INTRAVENOUS

## 2012-07-02 MED ORDER — SODIUM CHLORIDE 0.9 % IV SOLN
INTRAVENOUS | Status: DC | PRN
  Administered 2012-07-02: 17:00:00

## 2012-07-02 MED ORDER — GLYCOPYRROLATE 0.2 MG/ML IJ SOLN
INTRAMUSCULAR | Status: DC | PRN
  Administered 2012-07-02: 0.6 mg via INTRAVENOUS

## 2012-07-02 NOTE — Progress Notes (Signed)
ERCP demonstrated a large (10-41mm) stone which was extracted following sphincterotomy. OK to advance diet.  Can d/c antibiotics and d/c home in am if ok.

## 2012-07-02 NOTE — Transfer of Care (Signed)
Immediate Anesthesia Transfer of Care Note  Patient: [name]  Procedure(s) Performed: Procedure(s): ENDOSCOPIC RETROGRADE CHOLANGIOPANCREATOGRAPHY (ERCP) (N/A)  Patient Location: PACU  Anesthesia Type:General  Level of Consciousness: awake, alert  and oriented  Airway & Oxygen Therapy: Patient Spontanous Breathing and Patient connected to face mask oxygen  Post-op Assessment: Report given to PACU RN and Post -op Vital signs reviewed and stable  Post vital signs: Reviewed and stable  Complications: No apparent anesthesia complications

## 2012-07-02 NOTE — H&P (View-Only) (Signed)
<principal problem not specified>  Assessment: <principal problem not specified> Apparent CBD stone causing obstruction, no evidence of bile leak on sono.  Plan: Await GI consult, likely ERCP today - being seen by GI now   Subjective: Still some pain, better with pain meds  Objective: Vital signs in last 24 hours: Temp:  [98 F (36.7 C)-99 F (37.2 C)] 98.9 F (37.2 C) (02/26 0644) Pulse Rate:  [109-126] 109 (02/26 0644) Resp:  [18-24] 18 (02/26 0644) BP: (131-139)/(86-90) 139/90 mmHg (02/26 0644) SpO2:  [94 %-98 %] 98 % (02/26 0644) Weight:  [164 lb 15.3 oz (74.823 kg)] 164 lb 15.3 oz (74.823 kg) (02/25 2250)    Intake/Output from previous day: 02/25 0701 - 02/26 0700 In: 1083.3 [I.V.:783.3; IV Piggyback:300] Out: 0  Intake/Output this shift:    General appearance: alert, cooperative and mild distress GI: Soft, mildly tender, no rebound, incisions healing nicely Skin: jaundice noted  Lab Results:  Results for orders placed during the hospital encounter of 07/01/12 (from the past 24 hour(s))  CBC WITH DIFFERENTIAL     Status: Abnormal   Collection Time    07/01/12  7:16 PM      Result Value Range   WBC 12.6 (*) 4.0 - 10.5 K/uL   RBC 4.72  3.87 - 5.11 MIL/uL   Hemoglobin 14.3  12.0 - 15.0 g/dL   HCT 40.9  81.1 - 91.4 %   MCV 90.3  78.0 - 100.0 fL   MCH 30.3  26.0 - 34.0 pg   MCHC 33.6  30.0 - 36.0 g/dL   RDW 78.2  95.6 - 21.3 %   Platelets 218  150 - 400 K/uL   Neutrophils Relative 85 (*) 43 - 77 %   Neutro Abs 10.7 (*) 1.7 - 7.7 K/uL   Lymphocytes Relative 7 (*) 12 - 46 %   Lymphs Abs 0.9  0.7 - 4.0 K/uL   Monocytes Relative 8  3 - 12 %   Monocytes Absolute 1.0  0.1 - 1.0 K/uL   Eosinophils Relative 0  0 - 5 %   Eosinophils Absolute 0.0  0.0 - 0.7 K/uL   Basophils Relative 0  0 - 1 %   Basophils Absolute 0.0  0.0 - 0.1 K/uL  COMPREHENSIVE METABOLIC PANEL     Status: Abnormal   Collection Time    07/01/12  7:16 PM      Result Value Range   Sodium 137   135 - 145 mEq/L   Potassium 3.7  3.5 - 5.1 mEq/L   Chloride 97  96 - 112 mEq/L   CO2 26  19 - 32 mEq/L   Glucose, Bld 90  70 - 99 mg/dL   BUN 8  6 - 23 mg/dL   Creatinine, Ser 0.86  0.50 - 1.10 mg/dL   Calcium 57.8  8.4 - 46.9 mg/dL   Total Protein 8.6 (*) 6.0 - 8.3 g/dL   Albumin 3.8  3.5 - 5.2 g/dL   AST 629 (*) 0 - 37 U/L   ALT 354 (*) 0 - 35 U/L   Alkaline Phosphatase 1576 (*) 39 - 117 U/L   Total Bilirubin 8.5 (*) 0.3 - 1.2 mg/dL   GFR calc non Af Amer >90  >90 mL/min   GFR calc Af Amer >90  >90 mL/min  LACTIC ACID, PLASMA     Status: None   Collection Time    07/01/12  8:49 PM      Result Value Range  Lactic Acid, Venous 1.1  0.5 - 2.2 mmol/L  CBC     Status: Abnormal   Collection Time    07/02/12  4:12 AM      Result Value Range   WBC 11.5 (*) 4.0 - 10.5 K/uL   RBC 4.29  3.87 - 5.11 MIL/uL   Hemoglobin 13.1  12.0 - 15.0 g/dL   HCT 45.4  09.8 - 11.9 %   MCV 91.8  78.0 - 100.0 fL   MCH 30.5  26.0 - 34.0 pg   MCHC 33.2  30.0 - 36.0 g/dL   RDW 14.7  82.9 - 56.2 %   Platelets 195  150 - 400 K/uL  COMPREHENSIVE METABOLIC PANEL     Status: Abnormal   Collection Time    07/02/12  4:12 AM      Result Value Range   Sodium 138  135 - 145 mEq/L   Potassium 3.4 (*) 3.5 - 5.1 mEq/L   Chloride 101  96 - 112 mEq/L   CO2 25  19 - 32 mEq/L   Glucose, Bld 85  70 - 99 mg/dL   BUN 6  6 - 23 mg/dL   Creatinine, Ser 1.30  0.50 - 1.10 mg/dL   Calcium 9.2  8.4 - 86.5 mg/dL   Total Protein 6.8  6.0 - 8.3 g/dL   Albumin 3.2 (*) 3.5 - 5.2 g/dL   AST 784 (*) 0 - 37 U/L   ALT 270 (*) 0 - 35 U/L   Alkaline Phosphatase 1197 (*) 39 - 117 U/L   Total Bilirubin 7.1 (*) 0.3 - 1.2 mg/dL   GFR calc non Af Amer >90  >90 mL/min   GFR calc Af Amer >90  >90 mL/min  PROTIME-INR     Status: None   Collection Time    07/02/12  4:12 AM      Result Value Range   Prothrombin Time 12.9  11.6 - 15.2 seconds   INR 0.98  0.00 - 1.49  APTT     Status: None   Collection Time    07/02/12  4:12 AM       Result Value Range   aPTT 37  24 - 37 seconds     Studies/Results Radiology     MEDS, Scheduled . ciprofloxacin  400 mg Intravenous BID  . metronidazole  500 mg Intravenous Q8H  . pantoprazole (PROTONIX) IV  40 mg Intravenous QHS       LOS: 1 day    Currie Paris, MD, [facility], [place] [phone]   07/02/2012 9:15 AM

## 2012-07-02 NOTE — Consult Note (Signed)
Referring Provider: Dr. Johna Sheriff Primary Care Physician:  No primary provider on file. Primary Gastroenterologist:  Gentry Fitz  Reason for Consultation:  Jaundice and abdominal pain post cholecystectomy  HPI: Stephanie Watson is a 22 y.o. female who underwent an urgent laparoscopic cholecystectomy on February 14 for cholelithiasis and acute cholecystitis. At that time she was noted to have multiple stones in her cystic duct. Operative cholangiogram was difficult due to this but the catheter was advanced into the common bile duct and she had a normal distal bile duct although the proximal ducts were not well-visualized, according to Dr. Jamse Mead report. The patient had essentially normal liver function tests except for minimally elevated alkaline phosphatase of about 128 both preoperatively and 2 days postoperatively at discharge. She felt gradually better until about 3 days ago and since then has had the gradual onset of increasing right upper quadrant abdominal pain and some nausea without vomiting. She's had a low-grade fever. Yesterday evening the pain became quite a bit worse described as worse than her gallbladder pain preoperatively.  Her mother noticed that she was jaundiced and brought her to the emergency room for evaluation.  LFT's very elevated with total bili 8.5, ALP 1197 and elevated AST and ALT as well.  Still elevated this AM.  GI was consulted for ERCP for likely diagnosis of retained CBD stone.   History reviewed. No pertinent past medical history.  Past Surgical History  Procedure Laterality Date  . Cesarean section    . Cholecystectomy N/A 06/22/2012    Procedure: LAPAROSCOPIC CHOLECYSTECTOMY WITH INTRAOPERATIVE CHOLANGIOGRAM;  Surgeon: Mariella Saa, MD;  Location: [facility];  Service: General;  Laterality: N/A;    Prior to Admission medications   Medication Sig Start Date End Date Taking? Authorizing Provider  acetaminophen (TYLENOL) 325 MG tablet Take 650 mg by mouth  every 6 (six) hours as needed for pain.   Yes Historical Provider, MD  ciprofloxacin (CIPRO) 500 MG tablet Take 1 tablet (500 mg total) by mouth 2 (two) times daily. 06/24/12  Yes Sherrie George, PA  oxyCODONE-acetaminophen (PERCOCET/ROXICET) 5-325 MG per tablet Take 1-2 tablets by mouth every 4 (four) hours as needed for pain.   Yes Historical Provider, MD    Current Facility-Administered Medications  Medication Dose Route Frequency Provider Last Rate Last Dose  . ciprofloxacin (CIPRO) IVPB 400 mg  400 mg Intravenous BID Mariella Saa, MD   400 mg at 07/01/12 2321  . lactated ringers infusion   Intravenous Continuous Mariella Saa, MD 125 mL/hr at 07/01/12 2248    . metroNIDAZOLE (FLAGYL) IVPB 500 mg  500 mg Intravenous Q8H Mariella Saa, MD   500 mg at 07/02/12 4782  . morphine 2 MG/ML injection 2-6 mg  2-6 mg Intravenous Q1H PRN Mariella Saa, MD   6 mg at 07/02/12 0824  . ondansetron (ZOFRAN) injection 4 mg  4 mg Intravenous Q6H PRN Mariella Saa, MD      . pantoprazole (PROTONIX) injection 40 mg  40 mg Intravenous QHS Mariella Saa, MD   40 mg at 07/01/12 2321    Allergies as of 07/01/2012 - Review Complete 07/01/2012  Allergen Reaction Noted  . Latex Swelling 05/08/2012  . Penicillins Swelling 05/08/2012    Family History  Problem Relation Age of Onset  . Diabetes Mellitus II Mother   . Hyperlipidemia Father     History   Social History  . Marital Status: Married    Spouse Name: N/A    Number  of Children: N/A  . Years of Education: N/A   Occupational History  . Not on file.   Social History Main Topics  . Smoking status: Never Smoker   . Smokeless tobacco: Never Used  . Alcohol Use: No  . Drug Use: No  . Sexually Active: Yes    Birth Control/ Protection: Other-see comments     Comment: 8 weeks postpartum   Other Topics Concern  . Not on file   Social History Narrative  . No narrative on file    Review of Systems: Ten  point ROS is O/W negative except as mentioned in HPI.  Physical Exam: Vital signs in last 24 hours: Temp:  [98 F (36.7 C)-99 F (37.2 C)] 98.9 F (37.2 C) (02/26 0644) Pulse Rate:  [109-126] 109 (02/26 0644) Resp:  [18-24] 18 (02/26 0644) BP: (131-139)/(86-90) 139/90 mmHg (02/26 0644) SpO2:  [94 %-98 %] 98 % (02/26 0644) Weight:  [164 lb 15.3 oz (74.823 kg)] 164 lb 15.3 oz (74.823 kg) (02/25 2250)   General:   Alert, Well-developed, well-nourished, pleasant and cooperative in NAD; jaundice Head:  Normocephalic and atraumatic. Eyes:  Scleral icterus. Ears:  Normal auditory acuity. Mouth:  No deformity or lesions.   Lungs:  Clear throughout to auscultation.  No wheezes, crackles, or rhonchi.  Heart:  Regular rate and rhythm; no murmurs, clicks, rubs,  or gallops. Abdomen:  Soft, non-distended, BS active.  Upper abdominal TTP > in the RUQ. Rectal:  Deferred  Msk:  Symmetrical without gross deformities. Pulses:  Normal pulses noted. Extremities:  Without clubbing or edema. Neurologic:  Alert and  oriented x4;  grossly normal neurologically. Skin:  Intact without significant lesions or rashes.  Jaundiced. Psych:  Alert and cooperative. Normal mood and affect.  Intake/Output from previous day: 02/25 0701 - 02/26 0700 In: 1083.3 [I.V.:783.3; IV Piggyback:300] Out: 0   Lab Results:  Recent Labs  07/01/12 1916 07/02/12 0412  WBC 12.6* 11.5*  HGB 14.3 13.1  HCT 42.6 39.4  PLT 218 195   BMET  Recent Labs  07/01/12 1916 07/02/12 0412  NA 137 138  K 3.7 3.4*  CL 97 101  CO2 26 25  GLUCOSE 90 85  BUN 8 6  CREATININE 0.60 0.54  CALCIUM 10.3 9.2   LFT  Recent Labs  07/02/12 0412  PROT 6.8  ALBUMIN 3.2*  AST 158*  ALT 270*  ALKPHOS 1197*  BILITOT 7.1*   PT/INR  Recent Labs  07/02/12 0412  LABPROT 12.9  INR 0.98    Studies/Results: US Abdomen Limited Ruq  07/01/2012  *RADIOLOGY REPORT*  Clinical Data:  Abdominal pain.  Previous cholecystectomy  06/22/2012.  LIMITED ABDOMINAL ULTRASOUND - RIGHT UPPER QUADRANT  Comparison:  06/22/2012  Findings:  Gallbladder:  The gallbladder is surgically absent.  No fluid collections demonstrated in the gallbladder fossa.  Common bile duct:  Dilated intra and extrahepatic bile ducts. Common hepatic duct is measured at 12 mm diameter.  No intraductal stones are demonstrated, however the entire duct is not visualized due to overlying bowel gas.  Liver:  Normal homogeneous liver parenchymal echotexture.  IMPRESSION: The surgical absence of the gallbladder.  Mildly dilated intra and extrahepatic bile ducts.  No obstructing stone demonstrated, although the entire duct is not visualized due to overlying bowel gas.                    Original Report Authenticated By: Burman Nieves, M.D.     IMPRESSION:  -Elevated LFT's  with jaundice and abdominal pain s/p lap chole on 2/14.  Likely retained CBD stone.  PLAN: -ERCP later today.  On abx (cipro and flagyl).  ZEHR, JESSICA D.  07/02/2012, 9:05 AM  Pager number [phone] I have reviewed the above note, and agree with plan of treatment. Dr Arlyce Dice to perform ERCP later today.  [facility] Pager # [phone]

## 2012-07-02 NOTE — Care Management Note (Signed)
    Page 1 of 1   07/02/2012     2:55:29 PM   CARE MANAGEMENT NOTE 07/02/2012  Patient:  Stephanie Watson, Stephanie Watson   Account Number:  [id number]  Date Initiated:  07/02/2012  Documentation initiated by:  Lorenda Ishihara  Subjective/Objective Assessment:   22 yo female admitted with abd pain s/p lap chole 2/14. PTA lived at home with spouse.     Action/Plan:   Home when stable   Anticipated DC Date:  07/05/2012   Anticipated DC Plan:  HOME/SELF CARE      DC Planning Services  CM consult      Choice offered to / List presented to:             Status of service:  Completed, signed off Medicare Important Message given?   (If response is "NO", the following Medicare IM given date fields will be blank) Date Medicare IM given:   Date Additional Medicare IM given:    Discharge Disposition:  HOME/SELF CARE  Per UR Regulation:  Reviewed for med. necessity/level of care/duration of stay  If discussed at Long Length of Stay Meetings, dates discussed:    Comments:

## 2012-07-02 NOTE — Op Note (Addendum)
[facility] ,   ERCP PROCEDURE REPORT  PATIENT: Stephanie Watson, Stephanie B.  MR# :[id number] BIRTHDATE: [birth year]  GENDER: Female ENDOSCOPIST: Louis Meckel, MD REFERRED BY: PROCEDURE DATE:  07/02/2012 PROCEDURE:   ERCP with removal of calculus/calculi and ERCP with sphincterotomy/papillotomy ASA CLASS:   Class I INDICATIONS:suspected or rule out bile duct stones. MEDICATIONS: MAC sedation, administered by CRNA TOPICAL ANESTHETIC:  DESCRIPTION OF PROCEDURE:   After the risks benefits and alternatives of the procedure were thoroughly explained, informed consent was obtained.  The duodenoscope [id number]  endoscope was introduced through the mouth  and advanced to the second portion of the duodenum .  1.  The pancreatic duct was freely cannulated with a 0.23mm guidewire.  Despite multiple attempts the wire continued to fall into the pancreatic duct.  Over a guidewire a 4Fr3cm pigtail pancreatic duct stent was temporarily placed.  The CBD was then cannulated with a 0.43mm guidewire and the pancreatic stent was removed with a polypectomy snare.  Bile duct injection demonstrated a 10-37mm filling defect just behind the scope in the midportion of the CBD.  There were few stone fragments in the distal duct. A 12-88mm sphincterotomy was made.  A large stone measuring 10-57mm was immediately extracted from the distal CBD.  The CBD was then swept multiple times with a 12 and 15mm balloon stone extractor. The 12mm balloon was pulled through the sphincterotomized papilla at least 6 times.   No further stones were extracted from the duct. Final cholangiogram was normal except for mild dilitation.  The scope was then completely withdrawn from the patient and the procedure terminated.     COMPLICATIONS:  ENDOSCOPIC IMPRESSION: CBD stones - s/p sphincterotomy and stone extraction  RECOMMENDATIONS: Advance diet as tolerated    _______________________________ eSigned:  Louis Meckel, MD 07/02/2012 5:02 PM   CC:

## 2012-07-02 NOTE — ED Provider Notes (Signed)
History     CSN: [id number]  Arrival date & time 07/01/12  1823   First MD Initiated Contact with Patient 07/01/12 1937      Chief Complaint  Patient presents with  . s/p gallbladder surgery   . Jaundice    (Consider location/radiation/quality/duration/timing/severity/associated sxs/prior treatment) HPI Comments: Pt comes in with cc of abd pain. Patient is a 22 year old female who underwent an urgent laparoscopic cholecystectomy on February 14 for cholelithiasis and acute cholecystitis. Pt initially felt better until about 3 days ago and since then has had the gradual onset of increasing right upper quadrant abdominal pain and some nausea without vomiting. She's had a low-grade fever. This evening the pain became quite a bit worse described is worse than her gallbladder pain preoperatively. She came into the emergency room for evaluation. Pt is also noted to have some icteric sclerae. She has been taking cipro, and took a dose this morning.    The history is provided by the patient and medical records.    History reviewed. No pertinent past medical history.  Past Surgical History  Procedure Laterality Date  . Cesarean section    . Cholecystectomy N/A 06/22/2012    Procedure: LAPAROSCOPIC CHOLECYSTECTOMY WITH INTRAOPERATIVE CHOLANGIOGRAM;  Surgeon: Mariella Saa, MD;  Location: [facility]S;  Service: General;  Laterality: N/A;    Family History  Problem Relation Age of Onset  . Diabetes Mellitus II Mother   . Hyperlipidemia Father     History  Substance Use Topics  . Smoking status: Never Smoker   . Smokeless tobacco: Never Used  . Alcohol Use: No    OB History   Grav Para Term Preterm Abortions TAB SAB Ect Mult Living   2 2 2       2       Review of Systems  Constitutional: Positive for fever and fatigue. Negative for activity change.  HENT: Negative for facial swelling and neck pain.   Respiratory: Negative for cough, shortness of breath and wheezing.    Cardiovascular: Negative for chest pain.  Gastrointestinal: Positive for abdominal pain and abdominal distention. Negative for nausea, vomiting, diarrhea, constipation and blood in stool.  Genitourinary: Negative for hematuria and difficulty urinating.  Skin: Positive for color change.  Neurological: Negative for speech difficulty.  Hematological: Does not bruise/bleed easily.  Psychiatric/Behavioral: Negative for confusion.    Allergies  Latex and Penicillins  Home Medications  No current outpatient prescriptions on file.  BP 131/86  Pulse 110  Temp(Src) 98.2 F (36.8 C) (Axillary)  Resp 18  Ht 5' (1.524 m)  Wt 164 lb 15.3 oz (74.823 kg)  BMI 32.22 kg/m2  SpO2 94%  Breastfeeding? No  Physical Exam  Constitutional: She is oriented to person, place, and time. She appears well-developed.  HENT:  Head: Normocephalic and atraumatic.  Eyes: Conjunctivae and EOM are normal. Pupils are equal, round, and reactive to light.  Neck: Normal range of motion. Neck supple.  Cardiovascular: Normal rate, regular rhythm and normal heart sounds.   Pulmonary/Chest: Effort normal and breath sounds normal. No respiratory distress.  Abdominal: Soft. Bowel sounds are normal. She exhibits no distension. There is tenderness. There is no rebound and no guarding.  RUQ tenderness with rebound and guarding  Neurological: She is alert and oriented to person, place, and time.  Skin: Skin is warm and dry.    ED Course  Procedures (including critical care time)  Labs Reviewed  CBC WITH DIFFERENTIAL - Abnormal; Notable for the following:  WBC 12.6 (*)    Neutrophils Relative 85 (*)    Neutro Abs 10.7 (*)    Lymphocytes Relative 7 (*)    All other components within normal limits  COMPREHENSIVE METABOLIC PANEL - Abnormal; Notable for the following:    Total Protein 8.6 (*)    AST 211 (*)    ALT 354 (*)    Alkaline Phosphatase 1576 (*)    Total Bilirubin 8.5 (*)    All other components within  normal limits  LACTIC ACID, PLASMA  CBC  COMPREHENSIVE METABOLIC PANEL  PROTIME-INR  APTT   US Abdomen Limited Ruq  07/01/2012  *RADIOLOGY REPORT*  Clinical Data:  Abdominal pain.  Previous cholecystectomy 06/22/2012.  LIMITED ABDOMINAL ULTRASOUND - RIGHT UPPER QUADRANT  Comparison:  06/22/2012  Findings:  Gallbladder:  The gallbladder is surgically absent.  No fluid collections demonstrated in the gallbladder fossa.  Common bile duct:  Dilated intra and extrahepatic bile ducts. Common hepatic duct is measured at 12 mm diameter.  No intraductal stones are demonstrated, however the entire duct is not visualized due to overlying bowel gas.  Liver:  Normal homogeneous liver parenchymal echotexture.  IMPRESSION: The surgical absence of the gallbladder.  Mildly dilated intra and extrahepatic bile ducts.  No obstructing stone demonstrated, although the entire duct is not visualized due to overlying bowel gas.                    Original Report Authenticated By: Burman Nieves, M.D.      1. Cholangitis       MDM  DDx includes: Cholangitis Pancreatitis Hepatobiliary pathology including cholecystitis Gastritis/PUD SBO  Pt comes in with cc of abd pain. Pt is s/p recent cholecystectomy. She clinically has cholangitis - with RUQ pain, fever and jaundice. Dr. Marvene Staff called -he will admit the patient, and he thinks ERCP will be reqd.   Derwood Kaplan, MD 07/02/12 [id number]

## 2012-07-02 NOTE — Interval H&P Note (Signed)
History and Physical Interval Note:  07/02/2012 5:05 PM  Stephanie Watson  has presented today for surgery, with the diagnosis of gall stones   The various methods of treatment have been discussed with the patient and family. After consideration of risks, benefits and other options for treatment, the patient has consented to  Procedure(s): ENDOSCOPIC RETROGRADE CHOLANGIOPANCREATOGRAPHY (ERCP) (N/A) as a surgical intervention .  The patient's history has been reviewed, patient examined, no change in status, stable for surgery.  I have reviewed the patient's chart and labs.  Questions were answered to the patient's satisfaction.    The recent H&P (dated *07/02/12**) was reviewed, the patient was examined and there is no change in the patients condition since that H&P was completed.   [name]  07/02/2012, 5:05 PM    [name]

## 2012-07-02 NOTE — Progress Notes (Signed)
Patient had episode of urinating in bed. Stephanie Ard RN

## 2012-07-02 NOTE — Progress Notes (Signed)
<principal problem not specified>  Assessment: <principal problem not specified> Apparent CBD stone causing obstruction, no evidence of bile leak on sono.  Plan: Await GI consult, likely ERCP today - being seen by GI now   Subjective: Still some pain, better with pain meds  Objective: Vital signs in last 24 hours: Temp:  [98 F (36.7 C)-99 F (37.2 C)] 98.9 F (37.2 C) (02/26 0644) Pulse Rate:  [109-126] 109 (02/26 0644) Resp:  [18-24] 18 (02/26 0644) BP: (131-139)/(86-90) 139/90 mmHg (02/26 0644) SpO2:  [94 %-98 %] 98 % (02/26 0644) Weight:  [164 lb 15.3 oz (74.823 kg)] 164 lb 15.3 oz (74.823 kg) (02/25 2250)    Intake/Output from previous day: 02/25 0701 - 02/26 0700 In: 1083.3 [I.V.:783.3; IV Piggyback:300] Out: 0  Intake/Output this shift:    General appearance: alert, cooperative and mild distress GI: Soft, mildly tender, no rebound, incisions healing nicely Skin: jaundice noted  Lab Results:  Results for orders placed during the hospital encounter of 07/01/12 (from the past 24 hour(s))  CBC WITH DIFFERENTIAL     Status: Abnormal   Collection Time    07/01/12  7:16 PM      Result Value Range   WBC 12.6 (*) 4.0 - 10.5 K/uL   RBC 4.72  3.87 - 5.11 MIL/uL   Hemoglobin 14.3  12.0 - 15.0 g/dL   HCT 42.6  36.0 - 46.0 %   MCV 90.3  78.0 - 100.0 fL   MCH 30.3  26.0 - 34.0 pg   MCHC 33.6  30.0 - 36.0 g/dL   RDW 13.6  11.5 - 15.5 %   Platelets 218  150 - 400 K/uL   Neutrophils Relative 85 (*) 43 - 77 %   Neutro Abs 10.7 (*) 1.7 - 7.7 K/uL   Lymphocytes Relative 7 (*) 12 - 46 %   Lymphs Abs 0.9  0.7 - 4.0 K/uL   Monocytes Relative 8  3 - 12 %   Monocytes Absolute 1.0  0.1 - 1.0 K/uL   Eosinophils Relative 0  0 - 5 %   Eosinophils Absolute 0.0  0.0 - 0.7 K/uL   Basophils Relative 0  0 - 1 %   Basophils Absolute 0.0  0.0 - 0.1 K/uL  COMPREHENSIVE METABOLIC PANEL     Status: Abnormal   Collection Time    07/01/12  7:16 PM      Result Value Range   Sodium 137   135 - 145 mEq/L   Potassium 3.7  3.5 - 5.1 mEq/L   Chloride 97  96 - 112 mEq/L   CO2 26  19 - 32 mEq/L   Glucose, Bld 90  70 - 99 mg/dL   BUN 8  6 - 23 mg/dL   Creatinine, Ser 0.60  0.50 - 1.10 mg/dL   Calcium 10.3  8.4 - 10.5 mg/dL   Total Protein 8.6 (*) 6.0 - 8.3 g/dL   Albumin 3.8  3.5 - 5.2 g/dL   AST 211 (*) 0 - 37 U/L   ALT 354 (*) 0 - 35 U/L   Alkaline Phosphatase 1576 (*) 39 - 117 U/L   Total Bilirubin 8.5 (*) 0.3 - 1.2 mg/dL   GFR calc non Af Amer >90  >90 mL/min   GFR calc Af Amer >90  >90 mL/min  LACTIC ACID, PLASMA     Status: None   Collection Time    07/01/12  8:49 PM      Result Value Range     Lactic Acid, Venous 1.1  0.5 - 2.2 mmol/L  CBC     Status: Abnormal   Collection Time    07/02/12  4:12 AM      Result Value Range   WBC 11.5 (*) 4.0 - 10.5 K/uL   RBC 4.29  3.87 - 5.11 MIL/uL   Hemoglobin 13.1  12.0 - 15.0 g/dL   HCT 39.4  36.0 - 46.0 %   MCV 91.8  78.0 - 100.0 fL   MCH 30.5  26.0 - 34.0 pg   MCHC 33.2  30.0 - 36.0 g/dL   RDW 13.8  11.5 - 15.5 %   Platelets 195  150 - 400 K/uL  COMPREHENSIVE METABOLIC PANEL     Status: Abnormal   Collection Time    07/02/12  4:12 AM      Result Value Range   Sodium 138  135 - 145 mEq/L   Potassium 3.4 (*) 3.5 - 5.1 mEq/L   Chloride 101  96 - 112 mEq/L   CO2 25  19 - 32 mEq/L   Glucose, Bld 85  70 - 99 mg/dL   BUN 6  6 - 23 mg/dL   Creatinine, Ser 0.54  0.50 - 1.10 mg/dL   Calcium 9.2  8.4 - 10.5 mg/dL   Total Protein 6.8  6.0 - 8.3 g/dL   Albumin 3.2 (*) 3.5 - 5.2 g/dL   AST 158 (*) 0 - 37 U/L   ALT 270 (*) 0 - 35 U/L   Alkaline Phosphatase 1197 (*) 39 - 117 U/L   Total Bilirubin 7.1 (*) 0.3 - 1.2 mg/dL   GFR calc non Af Amer >90  >90 mL/min   GFR calc Af Amer >90  >90 mL/min  PROTIME-INR     Status: None   Collection Time    07/02/12  4:12 AM      Result Value Range   Prothrombin Time 12.9  11.6 - 15.2 seconds   INR 0.98  0.00 - 1.49  APTT     Status: None   Collection Time    07/02/12  4:12 AM       Result Value Range   aPTT 37  24 - 37 seconds     Studies/Results Radiology     MEDS, Scheduled . ciprofloxacin  400 mg Intravenous BID  . metronidazole  500 mg Intravenous Q8H  . pantoprazole (PROTONIX) IV  40 mg Intravenous QHS       LOS: 1 day    Yassir Enis J. Garv Kuechle, MD, FACS [facility] [phone]   07/02/2012 9:15 AM         

## 2012-07-02 NOTE — Anesthesia Preprocedure Evaluation (Addendum)
Anesthesia Evaluation  Patient identified by MRN, date of birth, ID band Patient awake    Reviewed: Allergy & Precautions, H&P , NPO status , Patient's Chart, lab work & pertinent test results  Airway Mallampati: II TM Distance: >3 FB Neck ROM: full    Dental no notable dental hx. (+) Teeth Intact and Dental Advisory Given   Pulmonary neg pulmonary ROS,  breath sounds clear to auscultation  Pulmonary exam normal       Cardiovascular Exercise Tolerance: Good negative cardio ROS  Rhythm:regular Rate:Normal     Neuro/Psych negative neurological ROS  negative psych ROS   GI/Hepatic negative GI ROS, Neg liver ROS,   Endo/Other  negative endocrine ROS  Renal/GU negative Renal ROS     Musculoskeletal   Abdominal   Peds  Hematology negative hematology ROS (+)   Anesthesia Other Findings   Reproductive/Obstetrics negative OB ROS                           Anesthesia Physical  Anesthesia Plan  ASA: I  Anesthesia Plan: General   Post-op Pain Management:    Induction: Intravenous  Airway Management Planned: Oral ETT  Additional Equipment:   Intra-op Plan:   Post-operative Plan: Extubation in OR  Informed Consent: I have reviewed the patients History and Physical, chart, labs and discussed the procedure including the risks, benefits and alternatives for the proposed anesthesia with the patient or authorized representative who has indicated his/her understanding and acceptance.   Dental Advisory Given  Plan Discussed with: CRNA and Surgeon  Anesthesia Plan Comments:         Anesthesia Quick Evaluation

## 2012-07-02 NOTE — Anesthesia Postprocedure Evaluation (Signed)
Anesthesia Post Note  Patient: [name]  Procedure(s) Performed: Procedure(s) (LRB): ENDOSCOPIC RETROGRADE CHOLANGIOPANCREATOGRAPHY (ERCP) (N/A)  Anesthesia type: General  Patient location: PACU  Post pain: Pain level controlled  Post assessment: Post-op Vital signs reviewed  Last Vitals:  Filed Vitals:   07/02/12 1700  BP: 123/73  Pulse: 110  Temp: 36.9 C  Resp: 19    Post vital signs: Reviewed  Level of consciousness: sedated  Complications: No apparent anesthesia complications

## 2012-07-03 ENCOUNTER — Encounter (HOSPITAL_COMMUNITY): Payer: Self-pay | Admitting: Gastroenterology

## 2012-07-03 LAB — COMPREHENSIVE METABOLIC PANEL
BUN: 5 mg/dL — ABNORMAL LOW (ref 6–23)
CO2: 29 mEq/L (ref 19–32)
Calcium: 9.1 mg/dL (ref 8.4–10.5)
Creatinine, Ser: 0.55 mg/dL (ref 0.50–1.10)
GFR calc Af Amer: >90 mL/min (ref >90)
GFR calc non Af Amer: >90 mL/min (ref >90)
Glucose, Bld: 100 mg/dL — ABNORMAL HIGH (ref 70–99)
Total Protein: 6.7 g/dL (ref 6.0–8.3)

## 2012-07-03 LAB — CBC
Hemoglobin: 12 g/dL (ref 12.0–15.0)
MCH: 29.9 pg (ref 26.0–34.0)
MCHC: 32.7 g/dL (ref 30.0–36.0)
MCV: 91.5 fL (ref 78.0–100.0)
RBC: 4.01 MIL/uL (ref 3.87–5.11)

## 2012-07-03 LAB — LIPASE, BLOOD: Lipase: 1442 U/L — ABNORMAL HIGH (ref 11–59)

## 2012-07-03 MED ORDER — HYDROCODONE-ACETAMINOPHEN 5-325 MG PO TABS: 1.0000 | ORAL_TABLET | ORAL | Status: DC | PRN

## 2012-07-03 MED ORDER — ACETAMINOPHEN 325 MG PO TABS
650.0000 mg | ORAL_TABLET | ORAL | Status: DC | PRN
  Administered 2012-07-03: 650 mg via ORAL
  Filled 2012-07-03: qty 2

## 2012-07-03 NOTE — Progress Notes (Signed)
Agree with A&P of [facility] . Patient likely home later today

## 2012-07-03 NOTE — Progress Notes (Signed)
Doing well after an ERCP/sphincterotomy and removal of a CBD stone yesterday. Temp max 99.7, Tolerates clear liquids. Abdomen soft, active bowl sounds, tender RUQ. No labs ordered.  Plan : advance diet, OK from GI stand point to D/C

## 2012-07-03 NOTE — Progress Notes (Signed)
Patient ID: Consepcion Watson, female   DOB: 06/08/1990, 21 y.o.   MRN: [id number] 1 Day Post-Op  Subjective: Feels much better today. Just mild epigastric discomfort. Also some productive cough.  Objective: Vital signs in last 24 hours: Temp:  [98.1 F (36.7 C)-99.7 F (37.6 C)] 99.3 F (37.4 C) (02/27 0602) Pulse Rate:  [85-126] 102 (02/27 0602) Resp:  [14-30] 16 (02/27 0602) BP: (108-140)/(57-81) 126/80 mmHg (02/27 0602) SpO2:  [88 %-100 %] 94 % (02/27 0602) Last BM Date: 06/30/12  Intake/Output from previous day: 02/26 0701 - 02/27 0700 In: 5410.4 [P.O.:700; I.V.:4010.4; IV Piggyback:700] Out: 2250 [Urine:2250] Intake/Output this shift: Total I/O In: -  Out: 650 [Urine:650]  General appearance: alert, cooperative and no distress.  Clearly less jaundiced Resp: clear to auscultation bilaterally GI: minimal epigastric tenderness much improved from admission  Lab Results:   Recent Labs  07/01/12 1916 07/02/12 0412  WBC 12.6* 11.5*  HGB 14.3 13.1  HCT 42.6 39.4  PLT 218 195   BMET  Recent Labs  07/01/12 1916 07/02/12 0412  NA 137 138  K 3.7 3.4*  CL 97 101  CO2 26 25  GLUCOSE 90 85  BUN 8 6  CREATININE 0.60 0.54  CALCIUM 10.3 9.2     Studies/Results: Dg Ercp Biliary & Pancreatic Ducts  07/02/2012  *RADIOLOGY REPORT*  Clinical Data: Common bile duct stone  ERCP  Comparison:  Intraoperative cholangiogram - 06/22/2012; abdominal ultrasound - 07/01/2012  Findings:  Multiple spot fluoroscopic radiographic images during ERCP are provided for review.  Initial image demonstrates an ERCP probe overlying the right upper abdominal quadrant.  Cholecystectomy clips overlie the expected location of the gallbladder fossa.  There is selective cannulation of the pancreatic duct with subsequent placement of a largely radiolucent temporary pancreatic stent, which was surgically removed at the completion of the procedure.  There is selective cannulation of the common bile duct.   Contrast injection of the common bile duct demonstrates several discrete filling defects within a moderately dilated common bile duct.  Subsequent images demonstrate a sphincterotomy balloon utilized to sweep the common bile duct and perform a sphincterotomy.  There is mild intrahepatic biliary ductal dilatation of the opacified portions the central aspect the biliary tree.  IMPRESSION: 1.  Intraoperative ERCP as above.  2.  Choledocholithiasis with subsequent stone removal and balloon sphincterotomy.  Images reviewed and discussed with Dr. Arlyce Dice at the time of procedure completion.   Original Report Authenticated By: Tacey Ruiz, MD    US Abdomen Limited Ruq  07/01/2012  *RADIOLOGY REPORT*  Clinical Data:  Abdominal pain.  Previous cholecystectomy 06/22/2012.  LIMITED ABDOMINAL ULTRASOUND - RIGHT UPPER QUADRANT  Comparison:  06/22/2012  Findings:  Gallbladder:  The gallbladder is surgically absent.  No fluid collections demonstrated in the gallbladder fossa.  Common bile duct:  Dilated intra and extrahepatic bile ducts. Common hepatic duct is measured at 12 mm diameter.  No intraductal stones are demonstrated, however the entire duct is not visualized due to overlying bowel gas.  Liver:  Normal homogeneous liver parenchymal echotexture.  IMPRESSION: The surgical absence of the gallbladder.  Mildly dilated intra and extrahepatic bile ducts.  No obstructing stone demonstrated, although the entire duct is not visualized due to overlying bowel gas.                    Original Report Authenticated By: Burman Nieves, M.D.     Anti-infectives: Anti-infectives   Start     Dose/Rate Route  Frequency Ordered Stop   07/01/12 2245  ciprofloxacin (CIPRO) IVPB 400 mg     400 mg 200 mL/hr over 60 Minutes Intravenous 2 times daily 07/01/12 2234     07/01/12 2245  metroNIDAZOLE (FLAGYL) IVPB 500 mg     500 mg 100 mL/hr over 60 Minutes Intravenous 3 times per day 07/01/12 2234        Assessment/Plan: s/p  Procedure(s): ENDOSCOPIC RETROGRADE CHOLANGIOPANCREATOGRAPHY (ERCP) Much improved after ERCP and stone extraction. Appears ready for discharge. I will see her in the office in 3 weeks and we will recheck LFTs.   LOS: 2 days    Marque Bango T 07/03/2012

## 2012-07-03 NOTE — Progress Notes (Signed)
1 Day Post-Op  Subjective: Still a bit sore, but feels better waiting on labs.  Hope to send her home if those are OK  Objective: Vital signs in last 24 hours: Temp:  [98.1 F (36.7 C)-99.7 F (37.6 C)] 99.3 F (37.4 C) (02/27 0602) Pulse Rate:  [85-126] 102 (02/27 0602) Resp:  [14-30] 16 (02/27 0602) BP: (108-140)/(57-81) 126/80 mmHg (02/27 0602) SpO2:  [88 %-100 %] 94 % (02/27 0602) Last BM Date: 06/30/12 700 PO yesterday, Diet: regular, temp in the 99.2-99.4 range. VSS, Hr up some. Waiting on AM labs Intake/Output from previous day: 02/26 0701 - 02/27 0700 In: 5410.4 [P.O.:700; I.V.:4010.4; IV Piggyback:700] Out: 2250 [Urine:2250] Intake/Output this shift: Total I/O In: -  Out: 650 [Urine:650]  General appearance: alert, cooperative and no distress GI: soft, non-tender; bowel sounds normal; no masses,  no organomegaly  Lab Results:   Recent Labs  07/01/12 1916 07/02/12 0412  WBC 12.6* 11.5*  HGB 14.3 13.1  HCT 42.6 39.4  PLT 218 195    BMET  Recent Labs  07/01/12 1916 07/02/12 0412  NA 137 138  K 3.7 3.4*  CL 97 101  CO2 26 25  GLUCOSE 90 85  BUN 8 6  CREATININE 0.60 0.54  CALCIUM 10.3 9.2   PT/INR  Recent Labs  07/02/12 0412  LABPROT 12.9  INR 0.98     Recent Labs Lab 07/01/12 1916 07/02/12 0412  AST 211* 158*  ALT 354* 270*  ALKPHOS 1576* 1197*  BILITOT 8.5* 7.1*  PROT 8.6* 6.8  ALBUMIN 3.8 3.2*     Lipase     Component Value Date/Time   LIPASE 25 06/22/2012 0630     Studies/Results: Dg Ercp Biliary & Pancreatic Ducts  07/02/2012  *RADIOLOGY REPORT*  Clinical Data: Common bile duct stone  ERCP  Comparison:  Intraoperative cholangiogram - 06/22/2012; abdominal ultrasound - 07/01/2012  Findings:  Multiple spot fluoroscopic radiographic images during ERCP are provided for review.  Initial image demonstrates an ERCP probe overlying the right upper abdominal quadrant.  Cholecystectomy clips overlie the expected location of the  gallbladder fossa.  There is selective cannulation of the pancreatic duct with subsequent placement of a largely radiolucent temporary pancreatic stent, which was surgically removed at the completion of the procedure.  There is selective cannulation of the common bile duct.  Contrast injection of the common bile duct demonstrates several discrete filling defects within a moderately dilated common bile duct.  Subsequent images demonstrate a sphincterotomy balloon utilized to sweep the common bile duct and perform a sphincterotomy.  There is mild intrahepatic biliary ductal dilatation of the opacified portions the central aspect the biliary tree.  IMPRESSION: 1.  Intraoperative ERCP as above.  2.  Choledocholithiasis with subsequent stone removal and balloon sphincterotomy.  Images reviewed and discussed with Dr. Arlyce Dice at the time of procedure completion.   Original Report Authenticated By: Tacey Ruiz, MD    US Abdomen Limited Ruq  07/01/2012  *RADIOLOGY REPORT*  Clinical Data:  Abdominal pain.  Previous cholecystectomy 06/22/2012.  LIMITED ABDOMINAL ULTRASOUND - RIGHT UPPER QUADRANT  Comparison:  06/22/2012  Findings:  Gallbladder:  The gallbladder is surgically absent.  No fluid collections demonstrated in the gallbladder fossa.  Common bile duct:  Dilated intra and extrahepatic bile ducts. Common hepatic duct is measured at 12 mm diameter.  No intraductal stones are demonstrated, however the entire duct is not visualized due to overlying bowel gas.  Liver:  Normal homogeneous liver parenchymal echotexture.  IMPRESSION:  The surgical absence of the gallbladder.  Mildly dilated intra and extrahepatic bile ducts.  No obstructing stone demonstrated, although the entire duct is not visualized due to overlying bowel gas.                    Original Report Authenticated By: Burman Nieves, M.D.     Medications: . ciprofloxacin  400 mg Intravenous BID  . metronidazole  500 mg Intravenous Q8H  . pantoprazole  (PROTONIX) IV  40 mg Intravenous QHS    Assessment/Plan Abdominal Pain, S/p LAP  Cholecystectomy 06/22/12 ERCP with removal of calculus/calculi and ERCP with  Sphincterotomy/papillotomy, 07/02/12 Dr. [name]   Plan:  If labs ok she can go home and follow up with prior appointment.  LOS: 2 days    Gerold Sar 07/03/2012

## 2012-07-03 NOTE — Discharge Summary (Signed)
   Patient ID: Stephanie Watson [id number] 21 y.o. Apr 27, 1991  07/01/2012  Discharge date and time: 07/03/2012   Admitting Physician: Glenna Fellows T  Discharge Physician: Glenna Fellows T  Admission Diagnoses: post op/jauandice/  abdominal pain Elevated LFT's; likely retained CBD stone gall stones   Discharge Diagnoses: same  Operations: Procedure(s): ENDOSCOPIC RETROGRADE CHOLANGIOPANCREATOGRAPHY (ERCP)  Admission Condition: fair  Discharged Condition: good  Indication for Admission: patient is a 22 year old female 10 days following laparoscopic cholecystectomy. She had a negative cholangiogram at the time of surgery but the quality x-ray was only fair. She did well for about a week but now presents with 3 days of worsening epigastric and right upper quadrant pain and today he noted yellow color to her eyes and skin. Evaluation in the emergency room showed a somewhat ill-appearing female with right upper quadrant tenderness. Bilirubin was 8.5 with markedly elevated alkaline phosphatase. Ultrasound showed dilated bile ducts. She was admitted with impression of common bile duct stone.  Hospital Course: patient was admitted in the evening and started on IV fluids, antibiotics and pain and nausea medication. She was seen in consultation by GI medicine and the following day ERCP was performed by Dr. Arlyce Dice.  There was a good size distal common bile duct stone was removed with a sphincterotomy. She tolerated the procedure well. On the following day her pain is essentially gone. She appears well. Her abdomen is benign. Vital signs are all within normal limits. Repeat LFTs are pending at the time of this dictation she is felt ready for discharge.  Consults: GI   Treatments: ERCP and stone extraction  Disposition: Home  Patient Instructions:    Medication List    STOP taking these medications       ciprofloxacin 500 MG tablet  Commonly known as:  CIPRO     oxyCODONE-acetaminophen 5-325 MG per tablet  Commonly known as:  PERCOCET/ROXICET      TAKE these medications       acetaminophen 325 MG tablet  Commonly known as:  TYLENOL  Take 650 mg by mouth every 6 (six) hours as needed for pain.     HYDROcodone-acetaminophen 5-325 MG per tablet  Commonly known as:  NORCO/VICODIN  Take 1-2 tablets by mouth every 4 (four) hours as needed for pain.        Activity: activity as tolerated Diet: regular diet Wound Care: none needed  Follow-up:  With Dr. Johna Sheriff in 3 weeks.  Signed: Mariella Saa MD, FACS  07/03/2012, 8:40 AM

## 2012-07-07 ENCOUNTER — Encounter (HOSPITAL_COMMUNITY): Payer: Self-pay | Admitting: Gastroenterology

## 2012-07-15 ENCOUNTER — Encounter (INDEPENDENT_AMBULATORY_CARE_PROVIDER_SITE_OTHER): Payer: BC Managed Care – PPO | Admitting: Surgery

## 2013-03-09 ENCOUNTER — Other Ambulatory Visit: Payer: Self-pay

## 2013-08-20 ENCOUNTER — Other Ambulatory Visit: Payer: Self-pay | Admitting: Obstetrics & Gynecology

## 2014-03-08 ENCOUNTER — Encounter (HOSPITAL_COMMUNITY): Payer: Self-pay | Admitting: Gastroenterology

## 2014-08-23 ENCOUNTER — Other Ambulatory Visit: Payer: Self-pay | Admitting: Obstetrics & Gynecology

## 2014-08-24 LAB — CYTOLOGY - PAP

## 2015-09-27 ENCOUNTER — Other Ambulatory Visit: Payer: Self-pay | Admitting: Obstetrics & Gynecology

## 2015-09-27 DIAGNOSIS — Z01419 Encounter for gynecological examination (general) (routine) without abnormal findings: Secondary | ICD-10-CM | POA: Diagnosis not present

## 2015-09-27 DIAGNOSIS — Z124 Encounter for screening for malignant neoplasm of cervix: Secondary | ICD-10-CM | POA: Diagnosis not present

## 2015-09-27 DIAGNOSIS — Z6839 Body mass index (BMI) 39.0-39.9, adult: Secondary | ICD-10-CM | POA: Diagnosis not present

## 2015-09-28 LAB — CYTOLOGY - PAP

## 2015-10-10 ENCOUNTER — Ambulatory Visit (INDEPENDENT_AMBULATORY_CARE_PROVIDER_SITE_OTHER): Payer: BLUE CROSS/BLUE SHIELD | Admitting: Primary Care

## 2015-10-10 ENCOUNTER — Encounter: Payer: Self-pay | Admitting: Primary Care

## 2015-10-10 VITALS — BP 124/82 | HR 122 | Temp 98.4°F | Ht 60.0 in | Wt 204.4 lb

## 2015-10-10 DIAGNOSIS — Z111 Encounter for screening for respiratory tuberculosis: Secondary | ICD-10-CM

## 2015-10-10 DIAGNOSIS — N926 Irregular menstruation, unspecified: Secondary | ICD-10-CM | POA: Insufficient documentation

## 2015-10-10 DIAGNOSIS — E669 Obesity, unspecified: Secondary | ICD-10-CM | POA: Diagnosis not present

## 2015-10-10 NOTE — Assessment & Plan Note (Signed)
Long history of for years. Follows with GYN, managed on OCP's. Endorses normal labs 1 month ago. No prior diagnosis of PCOS.

## 2015-10-10 NOTE — Progress Notes (Signed)
Pre visit review using our clinic review tool, if applicable. No additional management support is needed unless otherwise documented below in the visit note. 

## 2015-10-10 NOTE — Patient Instructions (Signed)
You've been provided with a TB skin test. Please come back within the time allotted to have this read.  Work to Countrywide Financialimprove your diet by reducing pastas, pizza, rice, potatoes, chips. Increase consumption of water, vegetables, fruit, whole grains, lean protein.  Start exercising. You should be getting 1 hour of moderate intensity exercise 5 days weekly.  Ensure you are consuming 64 ounces of water daily.  It was a pleasure to meet you today! Please don't hesitate to call me with any questions. Welcome to [facility]!

## 2015-10-10 NOTE — Assessment & Plan Note (Signed)
Long history of, strong FH. Overall fair diet, does not exercise. Discussed changes to make in diet and to start exercising. She has lost weight in the past by making healthy lifestyle changes.

## 2015-10-10 NOTE — Progress Notes (Signed)
Subjective:    Patient ID: [name], female    DOB: October 07, 1990, 25 y.o.   MRN: [id number]  HPI  Stephanie Watson is a 25 year old female who presents today to establish care. She has no complaints today. She is requesting a TB skin test for work. Her last physical was 1 month ago.   1) Obesity: Present for the past 4 years. She endorses a fair diet. She has an elliptical that she will use at home sometimes. She will also do some core strength training. She has a strong family history of obesity in both parents and sister.   Her diet currently consists of: Breakfast: Yogurt, bagel. Lunch: Vegetable, starch, meat Dinner: Sandwich, chips, pizza Snacks: Sometimes. Fruit, veggies. Desserts: Rarely. Beverages: Water (4-6 large glasses), coffee, occasionally Green Tea  Exercise: She hasn't exercised in the past 1 month.  2) Irregular Periods: Present for the past 2 years. LMP in April 2017. She is managed on OCP's for numerous years. Currently follows with GYN annually. She had labs completed 1 month ago and were normal. Denies heavy periods, abdominal pain.  Review of Systems  Constitutional: Negative for unexpected weight change.  Respiratory: Negative for shortness of breath.   Cardiovascular: Negative for chest pain.  Gastrointestinal: Negative for diarrhea and constipation.  Genitourinary:       Irregular periods.  Skin: Negative for rash.  Neurological: Negative for dizziness, numbness and headaches.       Past Medical History  Diagnosis Date  . Obesity      Social History   Social History  . Marital Status: Married    Spouse Name: N/A  . Number of Children: N/A  . Years of Education: N/A   Occupational History  . Not on file.   Social History Main Topics  . Smoking status: Never Smoker   . Smokeless tobacco: Never Used  . Alcohol Use: No  . Drug Use: No  . Sexual Activity: Yes    Birth Control/ Protection: Other-see comments     Comment: 8 weeks postpartum    Other Topics Concern  . Not on file   Social History Narrative   Married.   2 children.   Works as a Lawyer in [employer].   Enjoys spending time with her family.     Past Surgical History  Procedure Laterality Date  . Cesarean section    . Cholecystectomy N/A 06/22/2012    Procedure: LAPAROSCOPIC CHOLECYSTECTOMY WITH INTRAOPERATIVE CHOLANGIOGRAM;  Surgeon: Mariella Saa, MD;  Location: [facility];  Service: General;  Laterality: N/A;  . Ercp N/A 07/02/2012    Procedure: ENDOSCOPIC RETROGRADE CHOLANGIOPANCREATOGRAPHY (ERCP);  Surgeon: Louis Meckel, MD;  Location: [facility];  Service: Endoscopy;  Laterality: N/A;  . Ercp N/A 07/02/2012    Procedure: ENDOSCOPIC RETROGRADE CHOLANGIOPANCREATOGRAPHY (ERCP);  Surgeon: Louis Meckel, MD;  Location: [facility];  Service: Gastroenterology;  Laterality: N/A;    Family History  Problem Relation Age of Onset  . Diabetes Mellitus II Mother   . Hyperlipidemia Father   . Melanoma Maternal Grandmother   . Heart attack Paternal Grandfather   . Heart attack Maternal Grandmother     Allergies  Allergen Reactions  . Latex Swelling  . Penicillins Swelling    Current Outpatient Prescriptions on File Prior to Visit  Medication Sig Dispense Refill  . acetaminophen (TYLENOL) 325 MG tablet Take 650 mg by mouth every 6 (six) hours as needed for pain. Reported on 10/10/2015  No current facility-administered medications on file prior to visit.    BP 124/82 mmHg  Pulse 122  Temp(Src) 98.4 F (36.9 C) (Oral)  Ht 5' (1.524 m)  Wt 204 lb 6.4 oz (92.715 kg)  BMI 39.92 kg/m2  SpO2 90%    Objective:   Physical Exam  Constitutional: She appears well-nourished.  Neck: Neck supple.  Cardiovascular: Normal rate and regular rhythm.   Pulmonary/Chest: Effort normal and breath sounds normal.  Skin: Skin is warm and dry.  Psychiatric: She has a normal mood and affect.          Assessment & Plan:

## 2015-10-13 ENCOUNTER — Encounter: Payer: Self-pay | Admitting: *Deleted

## 2015-10-13 LAB — TB SKIN TEST
Induration: 0 mm
TB Skin Test: NEGATIVE

## 2016-11-28 ENCOUNTER — Encounter: Payer: Self-pay | Admitting: Internal Medicine

## 2016-11-28 ENCOUNTER — Ambulatory Visit (INDEPENDENT_AMBULATORY_CARE_PROVIDER_SITE_OTHER): Payer: BLUE CROSS/BLUE SHIELD | Admitting: Internal Medicine

## 2016-11-28 VITALS — BP 122/80 | HR 80 | Temp 98.6°F | Wt 198.0 lb

## 2016-11-28 DIAGNOSIS — B9789 Other viral agents as the cause of diseases classified elsewhere: Secondary | ICD-10-CM | POA: Diagnosis not present

## 2016-11-28 DIAGNOSIS — J329 Chronic sinusitis, unspecified: Secondary | ICD-10-CM

## 2016-11-28 NOTE — Patient Instructions (Signed)
Allergic Rhinitis Allergic rhinitis is when the mucous membranes in the nose respond to allergens. Allergens are particles in the air that cause your body to have an allergic reaction. This causes you to release allergic antibodies. Through a chain of events, these eventually cause you to release histamine into the blood stream. Although meant to protect the body, it is this release of histamine that causes your discomfort, such as frequent sneezing, congestion, and an itchy, runny nose. What are the causes? Seasonal allergic rhinitis (hay fever) is caused by pollen allergens that may come from grasses, trees, and weeds. Year-round allergic rhinitis (perennial allergic rhinitis) is caused by allergens such as house dust mites, pet dander, and mold spores. What are the signs or symptoms?  Nasal stuffiness (congestion).  Itchy, runny nose with sneezing and tearing of the eyes. How is this diagnosed? Your health care provider can help you determine the allergen or allergens that trigger your symptoms. If you and your health care provider are unable to determine the allergen, skin or blood testing may be used. Your health care provider will diagnose your condition after taking your health history and performing a physical exam. Your health care provider may assess you for other related conditions, such as asthma, pink eye, or an ear infection. How is this treated? Allergic rhinitis does not have a cure, but it can be controlled by:  Medicines that block allergy symptoms. These may include allergy shots, nasal sprays, and oral antihistamines.  Avoiding the allergen. Hay fever may often be treated with antihistamines in pill or nasal spray forms. Antihistamines block the effects of histamine. There are over-the-counter medicines that may help with nasal congestion and swelling around the eyes. Check with your health care provider before taking or giving this medicine. If avoiding the allergen or the  medicine prescribed do not work, there are many new medicines your health care provider can prescribe. Stronger medicine may be used if initial measures are ineffective. Desensitizing injections can be used if medicine and avoidance does not work. Desensitization is when a patient is given ongoing shots until the body becomes less sensitive to the allergen. Make sure you follow up with your health care provider if problems continue. Follow these instructions at home: It is not possible to completely avoid allergens, but you can reduce your symptoms by taking steps to limit your exposure to them. It helps to know exactly what you are allergic to so that you can avoid your specific triggers. Contact a health care provider if:  You have a fever.  You develop a cough that does not stop easily (persistent).  You have shortness of breath.  You start wheezing.  Symptoms interfere with normal daily activities. This information is not intended to replace advice given to you by your health care provider. Make sure you discuss any questions you have with your health care provider. Document Released: 01/16/2001 Document Revised: 12/23/2015 Document Reviewed: 12/29/2012 Elsevier Interactive Patient Education  2017 Elsevier Inc.  

## 2016-11-28 NOTE — Progress Notes (Signed)
HPI  Pt presents to the clinic today with c/o headache, nasal congestion, ear fullness, scratchy throat and cough. This started 1 week ago. She is blowing clear mucous out of her nose. She denies ear pain or decreased hearing. She denies difficulty swallowing. The cough is productive of yellow/green mucous at times. She reports she has run low grade fevers, but denies chills or body aches. She has tried Mucinex, OTC antihistamine, Sudafed and Tylenol Cold with minimal relief. She has no history of allergies or breathing problems. She has not had sick contacts that she is aware of.   Review of Systems     Past Medical History:  Diagnosis Date  . Obesity     Family History  Problem Relation Age of Onset  . Diabetes Mellitus II Mother   . Hyperlipidemia Father   . Melanoma Maternal Grandmother   . Heart attack Paternal Grandfather   . Heart attack Maternal Grandmother     Social History   Social History  . Marital status: Married    Spouse name: N/A  . Number of children: N/A  . Years of education: N/A   Occupational History  . Not on file.   Social History Main Topics  . Smoking status: Never Smoker  . Smokeless tobacco: Never Used  . Alcohol use No  . Drug use: No  . Sexual activity: Yes    Birth control/ protection: Other-see comments     Comment: 8 weeks postpartum   Other Topics Concern  . Not on file   Social History Narrative   Married.   2 children.   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   Enjoys spending time with her family.     Allergies  Allergen Reactions  . Latex Swelling  . Penicillins Swelling     Constitutional: Positive headache, fatigue and fever. Denies abrupt weight changes.  HEENT:  Positive, nasal congestion and sore throat. Denies eye redness, ear pain, ringing in the ears, wax buildup, runny nose or bloody nose. Respiratory: Positive cough. Denies difficulty breathing or shortness of breath.  Cardiovascular: Denies chest pain, chest  tightness, palpitations or swelling in the hands or feet.   No other specific complaints in a complete review of systems (except as listed in HPI above).  Objective:   BP 122/80   Pulse 80   Temp 98.6 F (37 C) (Oral)   Wt 198 lb (89.8 kg)   SpO2 98%   BMI 38.67 kg/m   General: Appears her stated age, in NAD. HEENT: Head: normal shape and size, no sinus tenderness noted; Ears: Tm's gray and intact, normal light reflex, + serous effusion bilaterally; Nose: mucosa boggy and moist, septum midline; Throat/Mouth: + PND. Teeth present, mucosa erythematous and moist, no exudate noted, no lesions or ulcerations noted.  Neck:  No adenopathy noted.  Pulmonary/Chest: Normal effort and positive vesicular breath sounds. No respiratory distress. No wheezes, rales or ronchi noted.       Assessment & Plan:   Acute Viral Sinusitis  Can use a Neti Pot which can be purchased from your local drug store. Flonase 2 sprays each nostril for 3 days and then as needed. Start Zyrtec OTC She declines Depo Medrol injection today  RTC as needed or if symptoms persist. Nicki ReaperBAITY, Graziella Connery, NP

## 2017-03-25 DIAGNOSIS — Z3202 Encounter for pregnancy test, result negative: Secondary | ICD-10-CM | POA: Diagnosis not present

## 2017-03-25 DIAGNOSIS — Z6841 Body Mass Index (BMI) 40.0 and over, adult: Secondary | ICD-10-CM | POA: Diagnosis not present

## 2017-03-25 DIAGNOSIS — Z124 Encounter for screening for malignant neoplasm of cervix: Secondary | ICD-10-CM | POA: Diagnosis not present

## 2017-03-25 DIAGNOSIS — Z01419 Encounter for gynecological examination (general) (routine) without abnormal findings: Secondary | ICD-10-CM | POA: Diagnosis not present

## 2018-03-25 ENCOUNTER — Encounter: Payer: BLUE CROSS/BLUE SHIELD | Admitting: Primary Care

## 2018-03-31 ENCOUNTER — Encounter: Payer: Self-pay | Admitting: Primary Care

## 2018-03-31 ENCOUNTER — Ambulatory Visit (INDEPENDENT_AMBULATORY_CARE_PROVIDER_SITE_OTHER): Payer: BLUE CROSS/BLUE SHIELD | Admitting: Primary Care

## 2018-03-31 VITALS — BP 120/76 | HR 92 | Temp 98.2°F | Ht 60.0 in | Wt 202.5 lb

## 2018-03-31 DIAGNOSIS — F32A Depression, unspecified: Secondary | ICD-10-CM

## 2018-03-31 DIAGNOSIS — F419 Anxiety disorder, unspecified: Secondary | ICD-10-CM

## 2018-03-31 DIAGNOSIS — Z0001 Encounter for general adult medical examination with abnormal findings: Secondary | ICD-10-CM | POA: Insufficient documentation

## 2018-03-31 DIAGNOSIS — F329 Major depressive disorder, single episode, unspecified: Secondary | ICD-10-CM

## 2018-03-31 DIAGNOSIS — N926 Irregular menstruation, unspecified: Secondary | ICD-10-CM | POA: Diagnosis not present

## 2018-03-31 DIAGNOSIS — Z3041 Encounter for surveillance of contraceptive pills: Secondary | ICD-10-CM | POA: Diagnosis not present

## 2018-03-31 DIAGNOSIS — Z0000 Encounter for general adult medical examination without abnormal findings: Secondary | ICD-10-CM | POA: Diagnosis not present

## 2018-03-31 DIAGNOSIS — R5383 Other fatigue: Secondary | ICD-10-CM | POA: Diagnosis not present

## 2018-03-31 NOTE — Assessment & Plan Note (Signed)
Doing well on current OCP's and will need a refill in early 2020. Pap smear due in 2020 and will get during her next CPE. Will refill medication once request received.

## 2018-03-31 NOTE — Assessment & Plan Note (Signed)
Symptoms x 1 year, worse over last several months. PHQ 9 score of 9 and GAD 7 score of 10 today.   Given duration of symptoms and now worsening of symptoms, offered treatment. Start by checking labs including TSH, B12, CBC, CMP, A1C. Offered different options for treatment including therapy vs medications, she opts for medication.  Will consider low dose sertraline once labs return. Will also discuss directions for use and potential side effects once prescribed. Recommend we see her back in 6 weeks for follow up.

## 2018-03-31 NOTE — Patient Instructions (Signed)
Stop by the lab prior to leaving today. I will notify you of your results once received.   Start exercising. You should be getting 150 minutes of moderate intensity exercise weekly.  It's important to improve your diet by reducing consumption of fast food, fried food, processed snack foods, sugary drinks. Increase consumption of fresh vegetables and fruits, whole grains, water.  Ensure you are drinking 64 ounces of water daily.  It was a pleasure to see you today!  

## 2018-03-31 NOTE — Progress Notes (Signed)
Subjective:    Patient ID: [name], female    DOB: 27-Jan-1991, 27 y.o.   MRN: [id number]  HPI  Stephanie Watson is a 27 year old female who presents today for complete physical. Stephanie Watson'd also like to discuss symptoms of anxiety and depression.   Symptoms of chest pressure, chest tightness, feeling anxious, easily angered, feels emotional, some difficulty sleeping, feeling fatigued, doesn't want to go anywhere or do anything, doesn't want to spend time with her children which is not like her.  Symptoms have been present for the last one year, increased over the last 2-3 months. GAD 7 score of 10 and PHQ score of 9 today. Stephanie Watson denies SI/HI. Stephanie Watson has a family history of depression and anxiety in her mother and sister.   Immunizations: -Tetanus: Unsure, believes it's been over 10 years. Declines today. -Influenza: Completed in September 2019   Diet: Stephanie Watson endorses a fair diet Breakfast: Skips Lunch: Sandwich, salad Dinner: Meat, vegetable, starch Snacks: Fruit cups, nuts, chips  Desserts: 2 times weekly on average  Beverages: Coffee with sugar, water, little soda  Exercise: Stephanie Watson is working out on her elliptical, just started back last week Eye exam: Completed in 2018, due in several weeks Dental exam: Completes semi-annually  Pap Smear: Completed in 2017, negative. Due in 2020   Review of Systems  Constitutional: Negative for unexpected weight change.  HENT: Negative for rhinorrhea.   Respiratory: Negative for cough and shortness of breath.   Cardiovascular: Negative for chest pain.  Gastrointestinal: Negative for constipation and diarrhea.  Genitourinary: Negative for difficulty urinating.       Irregular menstrual cycles, improved on OCP's.   Musculoskeletal: Negative for arthralgias and myalgias.  Skin: Negative for rash.  Allergic/Immunologic: Negative for environmental allergies.  Neurological: Negative for dizziness, numbness and headaches.  Psychiatric/Behavioral: The patient  is nervous/anxious.        See HPI       Past Medical History:  Diagnosis Date  . Obesity      Social History   Socioeconomic History  . Marital status: Married    Spouse name: Not on file  . Number of children: Not on file  . Years of education: Not on file  . Highest education level: Not on file  Occupational History  . Not on file  Social Needs  . Financial resource strain: Not on file  . Food insecurity:    Worry: Not on file    Inability: Not on file  . Transportation needs:    Medical: Not on file    Non-medical: Not on file  Tobacco Use  . Smoking status: Never Smoker  . Smokeless tobacco: Never Used  Substance and Sexual Activity  . Alcohol use: No  . Drug use: No  . Sexual activity: Yes    Birth control/protection: Other-see comments    Comment: 8 weeks postpartum  Lifestyle  . Physical activity:    Days per week: Not on file    Minutes per session: Not on file  . Stress: Not on file  Relationships  . Social connections:    Talks on phone: Not on file    Gets together: Not on file    Attends religious service: Not on file    Active member of club or organization: Not on file    Attends meetings of clubs or organizations: Not on file    Relationship status: Not on file  . Intimate partner violence:    Fear of current or  ex partner: Not on file    Emotionally abused: Not on file    Physically abused: Not on file    Forced sexual activity: Not on file  Other Topics Concern  . Not on file  Social History Narrative   Married.   2 children.   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   Enjoys spending time with her family.     Past Surgical History:  Procedure Laterality Date  . CESAREAN SECTION    . CHOLECYSTECTOMY N/A 06/22/2012   Procedure: LAPAROSCOPIC CHOLECYSTECTOMY WITH INTRAOPERATIVE CHOLANGIOGRAM;  Surgeon: Mariella SaaBenjamin T Hoxworth, MD;  Location: [facility];  Service: General;  Laterality: N/A;  . ERCP N/A 07/02/2012   Procedure: ENDOSCOPIC RETROGRADE  CHOLANGIOPANCREATOGRAPHY (ERCP);  Surgeon: Louis Meckelobert D Kaplan, MD;  [facility];  Service: Endoscopy;  Laterality: N/A;  . ERCP N/A 07/02/2012   Procedure: ENDOSCOPIC RETROGRADE CHOLANGIOPANCREATOGRAPHY (ERCP);  Surgeon: Louis Meckelobert D Kaplan, MD;  Location: [facility];  Service: Gastroenterology;  Laterality: N/A;    Family History  Problem Relation Age of Onset  . Diabetes Mellitus II Mother   . Hyperlipidemia Father   . Melanoma Maternal Grandmother   . Heart attack Paternal Grandfather   . Heart attack Maternal Grandmother     Allergies  Allergen Reactions  . Latex Swelling  . Penicillins Swelling    Current Outpatient Medications on File Prior to Visit  Medication Sig Dispense Refill  . desogestrel-ethinyl estradiol (VIORELE) 0.15-0.02/0.01 MG (21/5) tablet Take 1 tablet by mouth daily.      No current facility-administered medications on file prior to visit.     BP 120/76   Pulse 92   Temp 98.2 F (36.8 C) (Oral)   Ht 5' (1.524 m)   Wt 202 lb 8 oz (91.9 kg)   SpO2 97%   BMI 39.55 kg/m    Objective:   Physical Exam  Constitutional: Stephanie Watson is oriented to person, place, and time. Stephanie Watson appears well-nourished.  HENT:  Mouth/Throat: No oropharyngeal exudate.  Eyes: Pupils are equal, round, and reactive to light. EOM are normal.  Neck: Neck supple. No thyromegaly present.  Cardiovascular: Normal rate and regular rhythm.  Respiratory: Effort normal and breath sounds normal.  GI: Soft. Bowel sounds are normal. There is no tenderness.  Musculoskeletal: Normal range of motion.  Neurological: Stephanie Watson is alert and oriented to person, place, and time.  Skin: Skin is warm and dry.  Psychiatric: Stephanie Watson has a normal mood and affect.  Tearful at times during exam when discussing symptoms           Assessment & Plan:

## 2018-03-31 NOTE — Assessment & Plan Note (Signed)
Tetanus due, she declines today. Influenza vaccination UTD. Pap smear UTD, due in 2020. Discussed the importance of a healthy diet and regular exercise in order for weight loss, and to reduce the risk of any potential medical problems. Exam overall stable, see HPI. Labs pending. Follow up in 1 year for CPE.

## 2018-03-31 NOTE — Assessment & Plan Note (Signed)
Doing well on oral OCPs. Cycles still irregular but improved. Continue same.

## 2018-04-01 ENCOUNTER — Encounter: Payer: Self-pay | Admitting: Primary Care

## 2018-04-01 ENCOUNTER — Other Ambulatory Visit: Payer: Self-pay | Admitting: Primary Care

## 2018-04-01 ENCOUNTER — Other Ambulatory Visit (INDEPENDENT_AMBULATORY_CARE_PROVIDER_SITE_OTHER): Payer: BLUE CROSS/BLUE SHIELD

## 2018-04-01 DIAGNOSIS — R7989 Other specified abnormal findings of blood chemistry: Secondary | ICD-10-CM

## 2018-04-01 DIAGNOSIS — E039 Hypothyroidism, unspecified: Secondary | ICD-10-CM

## 2018-04-01 LAB — CBC
HCT: 40.2 % (ref 36.0–46.0)
HEMOGLOBIN: 13.6 g/dL (ref 12.0–15.0)
MCHC: 34 g/dL (ref 30.0–36.0)
MCV: 90.6 fl (ref 78.0–100.0)
Platelets: 253 10*3/uL (ref 150.0–400.0)
RBC: 4.43 Mil/uL (ref 3.87–5.11)
RDW: 12.6 % (ref 11.5–15.5)
WBC: 13.9 10*3/uL — AB (ref 4.0–10.5)

## 2018-04-01 LAB — COMPREHENSIVE METABOLIC PANEL
ALK PHOS: 87 U/L (ref 39–117)
ALT: 21 U/L (ref 0–35)
AST: 21 U/L (ref 0–37)
Albumin: 4.3 g/dL (ref 3.5–5.2)
BILIRUBIN TOTAL: 0.5 mg/dL (ref 0.2–1.2)
BUN: 13 mg/dL (ref 6–23)
CO2: 24 meq/L (ref 19–32)
CREATININE: 0.75 mg/dL (ref 0.40–1.20)
Calcium: 9.3 mg/dL (ref 8.4–10.5)
Chloride: 104 mEq/L (ref 96–112)
GFR: 98.99 mL/min (ref >60.00)
GLUCOSE: 80 mg/dL (ref 70–99)
Potassium: 3.8 mEq/L (ref 3.5–5.1)
Sodium: 138 mEq/L (ref 135–145)
TOTAL PROTEIN: 7.9 g/dL (ref 6.0–8.3)

## 2018-04-01 LAB — VITAMIN D 25 HYDROXY (VIT D DEFICIENCY, FRACTURES): VITD: 19.25 ng/mL — ABNORMAL LOW (ref 30.00–100.00)

## 2018-04-01 LAB — LIPID PANEL
CHOL/HDL RATIO: 4
Cholesterol: 166 mg/dL (ref 0–200)
HDL: 44 mg/dL (ref >39.00)
LDL Cholesterol: 98 mg/dL (ref 0–99)
NonHDL: 121.5
Triglycerides: 118 mg/dL (ref 0.0–149.0)
VLDL: 23.6 mg/dL (ref 0.0–40.0)

## 2018-04-01 LAB — TSH: TSH: 5.92 u[IU]/mL — AB (ref 0.35–4.50)

## 2018-04-01 LAB — T4, FREE: Free T4: 0.6 ng/dL (ref 0.60–1.60)

## 2018-04-01 LAB — VITAMIN B12: Vitamin B-12: 108 pg/mL — ABNORMAL LOW (ref 211–911)

## 2018-04-01 LAB — HEMOGLOBIN A1C: HEMOGLOBIN A1C: 4.9 % (ref 4.6–6.5)

## 2018-04-01 MED ORDER — LEVOTHYROXINE SODIUM 25 MCG PO TABS: ORAL_TABLET | ORAL | 1 refills | Status: DC

## 2018-04-23 ENCOUNTER — Telehealth: Payer: Self-pay

## 2018-04-23 ENCOUNTER — Other Ambulatory Visit: Payer: Self-pay | Admitting: Primary Care

## 2018-04-23 DIAGNOSIS — E039 Hypothyroidism, unspecified: Secondary | ICD-10-CM

## 2018-04-23 NOTE — Telephone Encounter (Signed)
Pt said [facility] told her she does not have refills on levothyroxine 25 mcg. I held on phone for 25' to [name] and  I advised pt to contact pharmacy and if they do not have available refills to contact [facility]. Pt voiced understanding.

## 2018-04-29 ENCOUNTER — Other Ambulatory Visit: Payer: Self-pay | Admitting: Primary Care

## 2018-04-29 DIAGNOSIS — E039 Hypothyroidism, unspecified: Secondary | ICD-10-CM

## 2018-04-29 NOTE — Telephone Encounter (Signed)
Received faxed refill request from [facility] for levothyroxine (SYNTHROID, LEVOTHROID) 25 MCG tablet  Last prescribed on 04/01/2018. Last seen on 03/31/2018. Next follow up on 05/12/2018

## 2018-04-29 NOTE — Telephone Encounter (Signed)
Will decline refill for now as she has an extra refill already.  We need to ensure that her thyroid function is stable on the current dose of levothyroxine before re-send refills.  We will see her as scheduled on May 12, 2018.

## 2018-05-12 ENCOUNTER — Encounter: Payer: Self-pay | Admitting: Primary Care

## 2018-05-12 ENCOUNTER — Ambulatory Visit: Payer: BLUE CROSS/BLUE SHIELD | Admitting: Primary Care

## 2018-05-12 VITALS — BP 120/80 | HR 89 | Temp 98.2°F | Ht 60.0 in | Wt 199.0 lb

## 2018-05-12 DIAGNOSIS — E039 Hypothyroidism, unspecified: Secondary | ICD-10-CM | POA: Insufficient documentation

## 2018-05-12 DIAGNOSIS — E538 Deficiency of other specified B group vitamins: Secondary | ICD-10-CM | POA: Diagnosis not present

## 2018-05-12 DIAGNOSIS — E559 Vitamin D deficiency, unspecified: Secondary | ICD-10-CM | POA: Diagnosis not present

## 2018-05-12 NOTE — Assessment & Plan Note (Signed)
Compliant to 1000 units daily. Repeat vitamin D pending.

## 2018-05-12 NOTE — Assessment & Plan Note (Signed)
Very low level last visit, compliant to vitamin B 12 orally. Repeat vitamin b12 pending. Consider injections if no significant improvement.

## 2018-05-12 NOTE — Assessment & Plan Note (Signed)
Diagnosed during last visit with TSH of 5.92 and Free T4 of 0.60. Overall feels somewhat better, unsure if this is due to levothyroxine, vitamin B 12, or Vitamin D. She is taking levothyroxine appropriately.  Repeat TSH pending.

## 2018-05-12 NOTE — Patient Instructions (Signed)
Stop by the lab prior to leaving today. I will notify you of your results once received.   It was a pleasure to see you today!  

## 2018-05-12 NOTE — Progress Notes (Signed)
Subjective:    Patient ID: Consepcion HearingAmber B Watson, female    DOB: 10-18-1990, 28 y.o.   MRN: [id number]  HPI  Ms. Stephanie Watson is a 28 year old female who presents today for follow up of hypothyroidism.  She was last evaluated on 04/01/18 with complaints of chest tightness, feeling anxious, difficulty sleeping, fatigue. GAD 7 score of 10 and PHQ 9 score of 9. Lab work completed which showed TSH of 5.92 and Free T4 of 0.60; B12 level of 108, and vitamin D of 19 so she was initiated on levothyroxine 25 mcg, vitamin D 1000 units, and vitamin B12 1000 mcg. We held off on anxiety/depression treatment given these results.  Since her last visit she's feeling slightly better but not back to her prior self. She's feeling more calm, mood overall has improved. She still feels fatigued, some days better than others. She's taking her levothyroxine every morning with water only, takes her other medications in the afternoon or evening. She is wait two hours before she eats.  Review of Systems  Constitutional: Positive for fatigue.  Respiratory: Negative for shortness of breath.   Cardiovascular: Negative for chest pain and palpitations.  Psychiatric/Behavioral:       See HPI       Past Medical History:  Diagnosis Date  . Obesity      Social History   Socioeconomic History  . Marital status: Married    Spouse name: Not on file  . Number of children: Not on file  . Years of education: Not on file  . Highest education level: Not on file  Occupational History  . Not on file  Social Needs  . Financial resource strain: Not on file  . Food insecurity:    Worry: Not on file    Inability: Not on file  . Transportation needs:    Medical: Not on file    Non-medical: Not on file  Tobacco Use  . Smoking status: Never Smoker  . Smokeless tobacco: Never Used  Substance and Sexual Activity  . Alcohol use: No  . Drug use: No  . Sexual activity: Yes    Birth control/protection: Other-see comments   Comment: 8 weeks postpartum  Lifestyle  . Physical activity:    Days per week: Not on file    Minutes per session: Not on file  . Stress: Not on file  Relationships  . Social connections:    Talks on phone: Not on file    Gets together: Not on file    Attends religious service: Not on file    Active member of club or organization: Not on file    Attends meetings of clubs or organizations: Not on file    Relationship status: Not on file  . Intimate partner violence:    Fear of current or ex partner: Not on file    Emotionally abused: Not on file    Physically abused: Not on file    Forced sexual activity: Not on file  Other Topics Concern  . Not on file  Social History Narrative   Married.   2 children.   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   Enjoys spending time with her family.     Past Surgical History:  Procedure Laterality Date  . CESAREAN SECTION    . CHOLECYSTECTOMY N/A 06/22/2012   Procedure: LAPAROSCOPIC CHOLECYSTECTOMY WITH INTRAOPERATIVE CHOLANGIOGRAM;  Surgeon: Mariella SaaBenjamin T Hoxworth, MD;  Location: [facility];  Service: General;  Laterality: N/A;  . ERCP N/A  07/02/2012   Procedure: ENDOSCOPIC RETROGRADE CHOLANGIOPANCREATOGRAPHY (ERCP);  Surgeon: Louis Meckel, MD;  Location: [facility];  Service: Endoscopy;  Laterality: N/A;  . ERCP N/A 07/02/2012   Procedure: ENDOSCOPIC RETROGRADE CHOLANGIOPANCREATOGRAPHY (ERCP);  Surgeon: Louis Meckel, MD;  Location: [facility];  Service: Gastroenterology;  Laterality: N/A;    Family History  Problem Relation Age of Onset  . Diabetes Mellitus II Mother   . Hyperlipidemia Father   . Melanoma Maternal Grandmother   . Heart attack Maternal Grandmother   . Heart attack Paternal Grandfather     Allergies  Allergen Reactions  . Latex Swelling  . Penicillins Swelling    Current Outpatient Medications on File Prior to Visit  Medication Sig Dispense Refill  . desogestrel-ethinyl estradiol (VIORELE) 0.15-0.02/0.01 MG (21/5) tablet Take  1 tablet by mouth daily.     Marland Kitchen levothyroxine (SYNTHROID, LEVOTHROID) 25 MCG tablet Take 1 tablet by mouth every morning on empty stomach with water.  No food or other medications for 30 minutes. 30 tablet 1   No current facility-administered medications on file prior to visit.     BP 120/80   Pulse 89   Temp 98.2 F (36.8 C) (Oral)   Ht 5' (1.524 m)   Wt 199 lb (90.3 kg)   SpO2 98%   BMI 38.86 kg/m    Objective:   Physical Exam  Constitutional: She appears well-nourished.  Neck: Neck supple. No thyromegaly present.  Cardiovascular: Normal rate and regular rhythm.  Respiratory: Effort normal and breath sounds normal.  Skin: Skin is warm and dry.  Psychiatric: She has a normal mood and affect.           Assessment & Plan:

## 2018-05-13 ENCOUNTER — Other Ambulatory Visit: Payer: Self-pay | Admitting: Primary Care

## 2018-05-13 DIAGNOSIS — E039 Hypothyroidism, unspecified: Secondary | ICD-10-CM

## 2018-05-13 LAB — VITAMIN B12: Vitamin B-12: 151 pg/mL — ABNORMAL LOW (ref 211–911)

## 2018-05-13 LAB — TSH: TSH: 2.44 u[IU]/mL (ref 0.35–4.50)

## 2018-05-13 LAB — VITAMIN D 25 HYDROXY (VIT D DEFICIENCY, FRACTURES): VITD: 25.11 ng/mL — AB (ref 30.00–100.00)

## 2018-05-17 ENCOUNTER — Other Ambulatory Visit: Payer: Self-pay | Admitting: Primary Care

## 2018-05-17 DIAGNOSIS — E039 Hypothyroidism, unspecified: Secondary | ICD-10-CM

## 2018-05-19 NOTE — Telephone Encounter (Signed)
Stephanie Watson, can you help? We need to send 90 days with [facility]. It wouldn't let me order.

## 2018-05-20 ENCOUNTER — Other Ambulatory Visit: Payer: Self-pay

## 2018-05-20 DIAGNOSIS — E039 Hypothyroidism, unspecified: Secondary | ICD-10-CM

## 2018-05-20 MED ORDER — LEVOTHYROXINE SODIUM 25 MCG PO TABS: ORAL_TABLET | ORAL | 3 refills | Status: DC

## 2018-05-20 NOTE — Telephone Encounter (Signed)
See MyChart message

## 2018-05-20 NOTE — Telephone Encounter (Signed)
I had to order it in a different message. Rx sent to [facility].

## 2018-05-22 ENCOUNTER — Ambulatory Visit (INDEPENDENT_AMBULATORY_CARE_PROVIDER_SITE_OTHER): Payer: BLUE CROSS/BLUE SHIELD | Admitting: *Deleted

## 2018-05-22 DIAGNOSIS — E538 Deficiency of other specified B group vitamins: Secondary | ICD-10-CM | POA: Diagnosis not present

## 2018-05-22 MED ORDER — CYANOCOBALAMIN 1000 MCG/ML IJ SOLN
1000.0000 ug | Freq: Once | INTRAMUSCULAR | Status: AC
  Administered 2018-05-22: 1000 ug via INTRAMUSCULAR

## 2018-05-22 NOTE — Progress Notes (Signed)
Per orders of Dr. Duncan, injection of b12 given by Tiffaney Heimann H. Patient tolerated injection well.  

## 2018-06-24 ENCOUNTER — Ambulatory Visit (INDEPENDENT_AMBULATORY_CARE_PROVIDER_SITE_OTHER): Payer: BLUE CROSS/BLUE SHIELD

## 2018-06-24 DIAGNOSIS — E538 Deficiency of other specified B group vitamins: Secondary | ICD-10-CM

## 2018-06-24 MED ORDER — CYANOCOBALAMIN 1000 MCG/ML IJ SOLN
1000.0000 ug | Freq: Once | INTRAMUSCULAR | Status: AC
  Administered 2018-06-24: 1000 ug via INTRAMUSCULAR

## 2018-06-24 NOTE — Progress Notes (Signed)
Per orders of Katherine Clark, NP, injection of vit B12 given by Cayenne Breault. Patient tolerated injection well.  

## 2018-07-04 ENCOUNTER — Telehealth: Payer: Self-pay

## 2018-07-04 NOTE — Telephone Encounter (Signed)
Last wk pt took her children to ped and was dx with stomach virus, N&V & diarrhea. For 3 days pt has had N&V& diarrhea on and off but pt was staying hydrated with gatorade; this morning watery diarrhea q 30' and pt lives 59' from [facility]. Upper abd pain with pain level 5 on and off. Pt still drinking gatorade. No fever. Pt request zofran to [facility]. Pt does not think she would have time to drive to [facility] due to diarrhea.Please advise.

## 2018-07-04 NOTE — Telephone Encounter (Signed)
Patient will need evaluation in the office for this request.  Since symptoms diarrhea has been present over three days she can trial some Pepto Bismol for diarrhea and nausea.  I strongly advise her to stay hydrated with water and low calorie Gatorade . I am happy to see her at her convenience.

## 2018-07-04 NOTE — Telephone Encounter (Signed)
Spoken to patient earlier this morning. Notified patient of Stephanie Watson comments. Patient verbalized understanding. I have suggestion patient if she get worse, she may go to urgent care or call us back this afternoon to may be come to our Saturday clinic.

## 2018-07-10 ENCOUNTER — Telehealth: Payer: Self-pay

## 2018-07-10 NOTE — Telephone Encounter (Signed)
Pt said few days ago pt was out of work with a stomach virus; pt has returned to work and this afternoon pt BP went down, BP 90/66 P 125 and pt had 30'-60' of mid chest pain that was sharp, tightness in chest and dizzy for approx 1 hr. No SOB. Now pt said heart rate is slightly elevated and pt has gone home after work. 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 Pt said started levothyroxine about 2 months ago; for the last 3 wks pt said BP drops in afternoon and heart rate goes up. Today pt also had chest pain, tightness and dizziness. Advised pt should be eval today at [facility] or ED. Pt said her husband works 3rd shift and does not have any one to watch her children. Pt request early morning appt and if pt develops CP,Tightness,dizziness, SOB or feels like BP drops and heart rate goes up before appt pt will call 911 and go to ED. Pt scheduled 30' appt with Dr Milinda Antis 07/11/18 at 8 AM. Angelica Chessman RN is aware also. FYI to Dr Milinda Antis.Allayne Gitelman NP is out of office this afternoon and does not have available appt on 07/11/18.

## 2018-07-10 NOTE — Telephone Encounter (Signed)
I will see her then  

## 2018-07-11 ENCOUNTER — Encounter: Payer: Self-pay | Admitting: Family Medicine

## 2018-07-11 ENCOUNTER — Ambulatory Visit (INDEPENDENT_AMBULATORY_CARE_PROVIDER_SITE_OTHER): Payer: BLUE CROSS/BLUE SHIELD | Admitting: Family Medicine

## 2018-07-11 VITALS — BP 96/54 | HR 77 | Temp 98.0°F | Ht 60.0 in | Wt 191.2 lb

## 2018-07-11 DIAGNOSIS — I959 Hypotension, unspecified: Secondary | ICD-10-CM

## 2018-07-11 DIAGNOSIS — J040 Acute laryngitis: Secondary | ICD-10-CM | POA: Diagnosis not present

## 2018-07-11 DIAGNOSIS — B9789 Other viral agents as the cause of diseases classified elsewhere: Secondary | ICD-10-CM

## 2018-07-11 DIAGNOSIS — R000 Tachycardia, unspecified: Secondary | ICD-10-CM | POA: Diagnosis not present

## 2018-07-11 DIAGNOSIS — I950 Idiopathic hypotension: Secondary | ICD-10-CM

## 2018-07-11 DIAGNOSIS — E538 Deficiency of other specified B group vitamins: Secondary | ICD-10-CM

## 2018-07-11 NOTE — Progress Notes (Signed)
Subjective:    Patient ID: [name], female    DOB: [birth year], 28 y.o.   MRN: [id number]  HPI Here for uri symptoms and also hx of low bp/tachycardia for months (2 mo)   Cold symptoms  2 days  Sinus congestion Prod cough- green phlegm  Very hoarse and no ST at all  No fever    28 yo pt of NP Clark   Wt Readings from Last 3 Encounters:  07/11/18 191 lb 4 oz (86.8 kg)  05/12/18 199 lb (90.3 kg)  03/31/18 202 lb 8 oz (91.9 kg)   37.35 kg/m   Yesterday 92/66 with HR of 125 at work   BP Readings from Last 3 Encounters:  07/11/18 (!) 96/54  05/12/18 120/80  03/31/18 120/76  takes an OC  Pulse Readings from Last 3 Encounters:  07/11/18 77  05/12/18 89  03/31/18 92  EKG shows NSR with rate of 99 today  Per pt a normal resting pulse is in the 90s   In the afternoons has episodes of low bp (gets weak and light headed)  HR goes up  No LOC recently  Has in the distance past  Getting worse and worse   She had this issue when she was pregnant   Drinks enough fluids Eating normal - 3 meals  Has lost almost 10 lb since November  Not exercising   EKG today shows sinus tach with rate of 99 (higher than at presentation) And no acute changes   In general has low energy  Getting B12 shots (getting monthly now)  Lab Results  Component Value Date   VITAMINB12 151 (L) 05/12/2018     H/o hypothyroidism  Lab Results  Component Value Date   TSH 2.44 05/12/2018   on 25 mcg levothy  GF had heart trouble in his 30s  He had high bp   Patient Active Problem List   Diagnosis Date Noted  . Viral laryngitis 07/11/2018  . Tachycardia 07/11/2018  . Hypotension 07/11/2018  . Hypothyroidism 05/12/2018  . Vitamin D deficiency 05/12/2018  . Vitamin B 12 deficiency 05/12/2018  . Encounter for birth control pills maintenance 03/31/2018  . Preventative health care 03/31/2018  . Anxiety and depression 03/31/2018  . Obesity (BMI 35.0-39.9 without comorbidity) 10/10/2015   . Irregular menstrual cycle 10/10/2015   Past Medical History:  Diagnosis Date  . Obesity    Past Surgical History:  Procedure Laterality Date  . CESAREAN SECTION    . CHOLECYSTECTOMY N/A 06/22/2012   Procedure: LAPAROSCOPIC CHOLECYSTECTOMY WITH INTRAOPERATIVE CHOLANGIOGRAM;  Surgeon: Mariella Saa, MD;  Location: [facility];  Service: General;  Laterality: N/A;  . ERCP N/A 07/02/2012   Procedure: ENDOSCOPIC RETROGRADE CHOLANGIOPANCREATOGRAPHY (ERCP);  Surgeon: Louis Meckel, MD;  Location: [facility];  Service: Endoscopy;  Laterality: N/A;  . ERCP N/A 07/02/2012   Procedure: ENDOSCOPIC RETROGRADE CHOLANGIOPANCREATOGRAPHY (ERCP);  Surgeon: Louis Meckel, MD;  Location: [facility];  Service: Gastroenterology;  Laterality: N/A;   Social History   Tobacco Use  . Smoking status: Never Smoker  . Smokeless tobacco: Never Used  Substance Use Topics  . Alcohol use: No  . Drug use: No   Family History  Problem Relation Age of Onset  . Diabetes Mellitus II Mother   . Hyperlipidemia Father   . Melanoma Maternal Grandmother   . Heart attack Maternal Grandmother   . Heart attack Paternal Grandfather    Allergies  Allergen Reactions  . Latex Swelling  . Penicillins  Swelling   Current Outpatient Medications on File Prior to Visit  Medication Sig Dispense Refill  . desogestrel-ethinyl estradiol (VIORELE) 0.15-0.02/0.01 MG (21/5) tablet Take 1 tablet by mouth daily.     Marland Kitchen levothyroxine (SYNTHROID, LEVOTHROID) 25 MCG tablet TAKE 1 TABLET EVERY MORNING ON EMPTY STOMACH WITH WATER-NO FOOD OR OTHER MEDICATIONS FOR 30 MINUTES. 90 tablet 3   No current facility-administered medications on file prior to visit.     Review of Systems  Constitutional: Positive for appetite change and fatigue. Negative for fever.  HENT: Positive for congestion, postnasal drip, rhinorrhea, sinus pressure, sneezing and sore throat. Negative for ear pain.   Eyes: Negative for pain and discharge.  Respiratory:  Positive for cough. Negative for chest tightness, shortness of breath, wheezing and stridor.   Cardiovascular: Positive for palpitations. Negative for chest pain and leg swelling.  Gastrointestinal: Negative for diarrhea, nausea and vomiting.  Genitourinary: Negative for frequency, hematuria and urgency.  Musculoskeletal: Negative for arthralgias and myalgias.  Skin: Negative for rash.  Neurological: Positive for light-headedness and headaches. Negative for dizziness, seizures, syncope, facial asymmetry, speech difficulty and weakness.  Psychiatric/Behavioral: Negative for confusion and dysphoric mood.       Objective:   Physical Exam Constitutional:      General: She is not in acute distress.    Appearance: Normal appearance. She is well-developed. She is obese. She is not ill-appearing, toxic-appearing or diaphoretic.  HENT:     Head: Normocephalic and atraumatic.     Comments: Nares are injected and congested      Right Ear: Tympanic membrane, ear canal and external ear normal.     Left Ear: Tympanic membrane, ear canal and external ear normal.     Nose: Congestion and rhinorrhea present.     Mouth/Throat:     Mouth: Mucous membranes are moist.     Pharynx: Oropharynx is clear. No oropharyngeal exudate or posterior oropharyngeal erythema.     Comments: Clear pnd   Hoarse voice Eyes:     General:        Right eye: No discharge.        Left eye: No discharge.     Conjunctiva/sclera: Conjunctivae normal.     Pupils: Pupils are equal, round, and reactive to light.  Neck:     Musculoskeletal: Normal range of motion and neck supple.  Cardiovascular:     Rate and Rhythm: Normal rate and regular rhythm.     Pulses: Normal pulses.     Heart sounds: Normal heart sounds.  Pulmonary:     Effort: Pulmonary effort is normal. No respiratory distress.     Breath sounds: Normal breath sounds. No stridor. No wheezing, rhonchi or rales.     Comments: Good air exch Chest:     Chest wall:  No tenderness.  Abdominal:     General: Abdomen is flat. Bowel sounds are normal. There is no distension.     Palpations: Abdomen is soft.     Tenderness: There is no abdominal tenderness.  Lymphadenopathy:     Cervical: No cervical adenopathy.  Skin:    General: Skin is warm and dry.     Capillary Refill: Capillary refill takes less than 2 seconds.     Findings: No rash.  Neurological:     Mental Status: She is alert.     Cranial Nerves: No cranial nerve deficit.  Psychiatric:        Mood and Affect: Mood normal.  Assessment & Plan:   Problem List Items Addressed This Visit      Cardiovascular and Mediastinum   Hypotension    Intermittent and symptomatic Also elevated HR at times  Neg orthostatic bp today and pulse is stable  Ref made to cardiology      Relevant Orders   Ambulatory referral to Cardiology   EKG 12-Lead (Completed)     Respiratory   Viral laryngitis - Primary    Reassuring exam  Will watch for wheezing Disc symptomatic care - see instructions on AVS  AVS: Keep drinking fluids  For congestion - sudafed PE (not pseudoephedrine) , saline nasal spray  For a few days - afrin nasal spray over the counter as needed for severe congestion (2-3 days maximum)  Breathing steam helps also  For cough /chest congestion -- mucinex DM   Voice rest when you can   Update if not starting to improve in a week or if worsening          Other   Vitamin B 12 deficiency    Getting monthly B12 shots now       Tachycardia    Intermittent and symptomatic  Plus/minus episodic hypotension  EKG reassuring but with tachycardia  TSH therapeutic  Ref to cardiology      Relevant Orders   Ambulatory referral to Cardiology

## 2018-07-11 NOTE — Patient Instructions (Addendum)
Keep drinking fluids  For congestion - sudafed PE (not pseudoephedrine) , saline nasal spray  For a few days - afrin nasal spray over the counter as needed for severe congestion (2-3 days maximum)  Breathing steam helps also  For cough /chest congestion -- mucinex DM   Voice rest when you can   Update if not starting to improve in a week or if worsening    For your heart rate and blood pressure issues lets get you set up with a cardiologist   Our office will call you about your cardiology appointment

## 2018-07-13 NOTE — Assessment & Plan Note (Signed)
Getting monthly B12 shots now

## 2018-07-13 NOTE — Assessment & Plan Note (Signed)
Intermittent and symptomatic  Plus/minus episodic hypotension  EKG reassuring but with tachycardia  TSH therapeutic  Ref to cardiology

## 2018-07-13 NOTE — Assessment & Plan Note (Signed)
Reassuring exam  Will watch for wheezing Disc symptomatic care - see instructions on AVS  AVS: Keep drinking fluids  For congestion - sudafed PE (not pseudoephedrine) , saline nasal spray  For a few days - afrin nasal spray over the counter as needed for severe congestion (2-3 days maximum)  Breathing steam helps also  For cough /chest congestion -- mucinex DM   Voice rest when you can   Update if not starting to improve in a week or if worsening

## 2018-07-13 NOTE — Assessment & Plan Note (Signed)
Intermittent and symptomatic Also elevated HR at times  Neg orthostatic bp today and pulse is stable  Ref made to cardiology

## 2018-07-24 ENCOUNTER — Other Ambulatory Visit: Payer: Self-pay | Admitting: Primary Care

## 2018-07-24 DIAGNOSIS — E559 Vitamin D deficiency, unspecified: Secondary | ICD-10-CM

## 2018-07-24 DIAGNOSIS — E538 Deficiency of other specified B group vitamins: Secondary | ICD-10-CM

## 2018-07-24 DIAGNOSIS — E039 Hypothyroidism, unspecified: Secondary | ICD-10-CM

## 2018-07-29 ENCOUNTER — Ambulatory Visit (INDEPENDENT_AMBULATORY_CARE_PROVIDER_SITE_OTHER): Payer: BLUE CROSS/BLUE SHIELD | Admitting: *Deleted

## 2018-07-29 ENCOUNTER — Other Ambulatory Visit: Payer: Self-pay

## 2018-07-29 ENCOUNTER — Other Ambulatory Visit (INDEPENDENT_AMBULATORY_CARE_PROVIDER_SITE_OTHER): Payer: BLUE CROSS/BLUE SHIELD

## 2018-07-29 DIAGNOSIS — E538 Deficiency of other specified B group vitamins: Secondary | ICD-10-CM | POA: Diagnosis not present

## 2018-07-29 DIAGNOSIS — E039 Hypothyroidism, unspecified: Secondary | ICD-10-CM

## 2018-07-29 MED ORDER — CYANOCOBALAMIN 1000 MCG/ML IJ SOLN
1000.0000 ug | Freq: Once | INTRAMUSCULAR | Status: AC
  Administered 2018-07-29: 1000 ug via INTRAMUSCULAR

## 2018-07-29 NOTE — Progress Notes (Signed)
Per orders of Kate Clark, NP, injection of B12 given by Tamey Wanek M. Patient tolerated injection well. 

## 2018-07-30 LAB — TSH: TSH: 2.21 u[IU]/mL (ref 0.35–4.50)

## 2018-07-30 LAB — VITAMIN B12: Vitamin B-12: 258 pg/mL (ref 211–911)

## 2018-09-03 ENCOUNTER — Ambulatory Visit (INDEPENDENT_AMBULATORY_CARE_PROVIDER_SITE_OTHER): Payer: BLUE CROSS/BLUE SHIELD

## 2018-09-03 ENCOUNTER — Other Ambulatory Visit: Payer: Self-pay

## 2018-09-03 DIAGNOSIS — E538 Deficiency of other specified B group vitamins: Secondary | ICD-10-CM

## 2018-09-03 MED ORDER — CYANOCOBALAMIN 1000 MCG/ML IJ SOLN
1000.0000 ug | Freq: Once | INTRAMUSCULAR | Status: AC
  Administered 2018-09-03: 1000 ug via INTRAMUSCULAR

## 2018-09-03 NOTE — Progress Notes (Addendum)
Per orders of [name], NP , injection of vitamin b12  given by Kizzie Ide IM to patients Right Deltoid. Patient tolerated injection well.

## 2018-10-08 ENCOUNTER — Ambulatory Visit (INDEPENDENT_AMBULATORY_CARE_PROVIDER_SITE_OTHER): Payer: BC Managed Care – PPO

## 2018-10-08 DIAGNOSIS — E538 Deficiency of other specified B group vitamins: Secondary | ICD-10-CM | POA: Diagnosis not present

## 2018-10-08 MED ORDER — CYANOCOBALAMIN 1000 MCG/ML IJ SOLN
1000.0000 ug | Freq: Once | INTRAMUSCULAR | Status: AC
  Administered 2018-10-08: 1000 ug via INTRAMUSCULAR

## 2018-10-08 NOTE — Progress Notes (Signed)
Per orders of Vernona Rieger, NP, injection of Vitamin B12 given by Maryella Shivers.  Administered to Left Deltoid IM.   Patient tolerated injection well.

## 2018-11-11 ENCOUNTER — Other Ambulatory Visit: Payer: Self-pay

## 2018-11-11 ENCOUNTER — Ambulatory Visit (INDEPENDENT_AMBULATORY_CARE_PROVIDER_SITE_OTHER): Payer: BC Managed Care – PPO

## 2018-11-11 DIAGNOSIS — E538 Deficiency of other specified B group vitamins: Secondary | ICD-10-CM

## 2018-11-11 MED ORDER — CYANOCOBALAMIN 1000 MCG/ML IJ SOLN
1000.0000 ug | Freq: Once | INTRAMUSCULAR | Status: AC
  Administered 2018-11-11: 1000 ug via INTRAMUSCULAR

## 2018-11-11 NOTE — Progress Notes (Signed)
Pt given monthly B12 injection in right deltoid. Tolerated well.   Pt mentioned she was going to send Allie Bossier a MyChart message about some issues she has going on.

## 2018-11-26 NOTE — Telephone Encounter (Signed)
Lab appointment on 12/11/2018

## 2018-11-26 NOTE — Telephone Encounter (Signed)
Stephanie Watson, will you change her upcoming appointments to an in office visit with me? I do believe that I am gone on the 6th, but anytime after is fine.

## 2018-12-05 ENCOUNTER — Other Ambulatory Visit: Payer: Self-pay | Admitting: Primary Care

## 2018-12-05 DIAGNOSIS — E559 Vitamin D deficiency, unspecified: Secondary | ICD-10-CM

## 2018-12-05 DIAGNOSIS — E538 Deficiency of other specified B group vitamins: Secondary | ICD-10-CM

## 2018-12-05 DIAGNOSIS — E039 Hypothyroidism, unspecified: Secondary | ICD-10-CM

## 2018-12-11 ENCOUNTER — Ambulatory Visit (INDEPENDENT_AMBULATORY_CARE_PROVIDER_SITE_OTHER): Payer: BC Managed Care – PPO | Admitting: *Deleted

## 2018-12-11 ENCOUNTER — Other Ambulatory Visit (INDEPENDENT_AMBULATORY_CARE_PROVIDER_SITE_OTHER): Payer: BC Managed Care – PPO

## 2018-12-11 ENCOUNTER — Other Ambulatory Visit: Payer: Self-pay

## 2018-12-11 DIAGNOSIS — E538 Deficiency of other specified B group vitamins: Secondary | ICD-10-CM | POA: Diagnosis not present

## 2018-12-11 DIAGNOSIS — E039 Hypothyroidism, unspecified: Secondary | ICD-10-CM

## 2018-12-11 DIAGNOSIS — E559 Vitamin D deficiency, unspecified: Secondary | ICD-10-CM

## 2018-12-11 LAB — COMPREHENSIVE METABOLIC PANEL
ALT: 13 U/L (ref 0–35)
AST: 13 U/L (ref 0–37)
Albumin: 4.3 g/dL (ref 3.5–5.2)
Alkaline Phosphatase: 67 U/L (ref 39–117)
BUN: 17 mg/dL (ref 6–23)
CO2: 26 mEq/L (ref 19–32)
Calcium: 9.9 mg/dL (ref 8.4–10.5)
Chloride: 108 mEq/L (ref 96–112)
Creatinine, Ser: 0.75 mg/dL (ref 0.40–1.20)
GFR: 92.65 mL/min (ref >60.00)
Glucose, Bld: 95 mg/dL (ref 70–99)
Potassium: 4.2 mEq/L (ref 3.5–5.1)
Sodium: 142 mEq/L (ref 135–145)
Total Bilirubin: 0.2 mg/dL (ref 0.2–1.2)
Total Protein: 7.7 g/dL (ref 6.0–8.3)

## 2018-12-11 LAB — CBC
HCT: 39.5 % (ref 36.0–46.0)
Hemoglobin: 13.3 g/dL (ref 12.0–15.0)
MCHC: 33.7 g/dL (ref 30.0–36.0)
MCV: 91.1 fl (ref 78.0–100.0)
Platelets: 264 10*3/uL (ref 150.0–400.0)
RBC: 4.33 Mil/uL (ref 3.87–5.11)
RDW: 12.5 % (ref 11.5–15.5)
WBC: 10.5 10*3/uL (ref 4.0–10.5)

## 2018-12-11 LAB — TSH: TSH: 3.46 u[IU]/mL (ref 0.35–4.50)

## 2018-12-11 LAB — VITAMIN D 25 HYDROXY (VIT D DEFICIENCY, FRACTURES): VITD: 30.7 ng/mL (ref 30.00–100.00)

## 2018-12-11 LAB — VITAMIN B12: Vitamin B-12: 244 pg/mL (ref 211–911)

## 2018-12-11 MED ORDER — CYANOCOBALAMIN 1000 MCG/ML IJ SOLN
1000.0000 ug | Freq: Once | INTRAMUSCULAR | Status: AC
  Administered 2018-12-11: 09:00:00 1000 ug via INTRAMUSCULAR

## 2018-12-11 NOTE — Progress Notes (Signed)
Per orders of Dr. Diona Browner in [place], NP absence, injection of Vit B12 given by Nyoka Cowden, Semiah Konczal M. Patient tolerated injection well.

## 2018-12-16 ENCOUNTER — Other Ambulatory Visit: Payer: BLUE CROSS/BLUE SHIELD

## 2018-12-16 ENCOUNTER — Ambulatory Visit: Payer: BLUE CROSS/BLUE SHIELD

## 2018-12-18 NOTE — Progress Notes (Signed)
Approved.  

## 2018-12-19 ENCOUNTER — Other Ambulatory Visit: Payer: Self-pay

## 2018-12-19 ENCOUNTER — Encounter: Payer: Self-pay | Admitting: Primary Care

## 2018-12-19 ENCOUNTER — Ambulatory Visit (INDEPENDENT_AMBULATORY_CARE_PROVIDER_SITE_OTHER): Payer: BC Managed Care – PPO | Admitting: Primary Care

## 2018-12-19 VITALS — BP 116/72 | HR 91 | Temp 98.6°F | Ht 60.0 in | Wt 196.5 lb

## 2018-12-19 DIAGNOSIS — F419 Anxiety disorder, unspecified: Secondary | ICD-10-CM

## 2018-12-19 DIAGNOSIS — E559 Vitamin D deficiency, unspecified: Secondary | ICD-10-CM | POA: Diagnosis not present

## 2018-12-19 DIAGNOSIS — E538 Deficiency of other specified B group vitamins: Secondary | ICD-10-CM | POA: Diagnosis not present

## 2018-12-19 DIAGNOSIS — E039 Hypothyroidism, unspecified: Secondary | ICD-10-CM | POA: Diagnosis not present

## 2018-12-19 DIAGNOSIS — F329 Major depressive disorder, single episode, unspecified: Secondary | ICD-10-CM

## 2018-12-19 DIAGNOSIS — F32A Depression, unspecified: Secondary | ICD-10-CM

## 2018-12-19 MED ORDER — SERTRALINE HCL 25 MG PO TABS: 25.0000 mg | ORAL_TABLET | Freq: Every day | ORAL | 1 refills | Status: DC

## 2018-12-19 NOTE — Patient Instructions (Signed)
Start sertraline (Zoloft) 25 mg once daily for anxiety and depression. Start by taking 1/2 tablet daily for 6 days, then increase to 1 full tablet thereafter.  Continue monthly B12 injections.  Start exercising. You should be getting 150 minutes of moderate intensity exercise weekly.  Schedule a follow up visit for 6 weeks for re-evaluation of anxiety and depression.   It was a pleasure to see you today!

## 2018-12-19 NOTE — Assessment & Plan Note (Signed)
Chronic, no improvement with treatment for B12 deficiency and hypothyroidism.  Discussed options for treatment today and she is ready and elects for medication. Rx for Zoloft 25 mg sent to pharmacy.  Patient is to take 1/2 tablet daily for 6 days, then advance to 1 full tablet thereafter. We discussed possible side effects of headache, GI upset, drowsiness, and SI/HI. If thoughts of SI/HI develop, we discussed to present to the emergency immediately. Patient verbalized understanding.   Follow up in 6 weeks for re-evaluation.

## 2018-12-19 NOTE — Assessment & Plan Note (Signed)
Compliant to levothyroxine and is taking appropriately. Recent TSH level stable. Continue 25 mcg for now. Consider increasing dose to 50 mcg if treatment for anxiety/depression does not improve fatigue.

## 2018-12-19 NOTE — Progress Notes (Signed)
Subjective:    Patient ID: Stephanie Watson, female    DOB: 1990/12/26, 28 y.o.   MRN: [id number]  HPI  Ms. Stephanie Watson is a 28 year old female who presents today for follow up.  1) Vitamin B12 Deficiency: Currently managed on IM b12 injections monthly for which she started in January 2020. She's also been taking Vitamin B12 20,000 mcg daily along with monthly B12 injections for the last three months. Recent B12 level of 244. She continues to feel "fatigued/wiped out".   2) Hypothyroidism: Originally diagnosed in Winter 2019. Currently managed on levothyroxine 25 mcg. Most recent TSH of 3.46. she is taking her levothyroxine every morning with water only. She waits to eat/drink anything but water for one hour. She takes her vitamins at bedtime. She continues to feel fatigued, also struggles to lose weight, some hair loss.   BP Readings from Last 3 Encounters:  12/19/18 116/72  07/11/18 (!) 96/54  05/12/18 120/80   3) Anxiety and Depression: Chronic for over one year. Symptoms include easily annoyed/irritable, fatigue, daily worry, feeling anxious/stressed. GAD 7 score of 17 and PHQ 9 score of 16 today. She was recently on vacation with her family and was told that she needed to "calm down". She is open for treatment. Has a lot of personal and work stressors. Denies SI/HI.  Review of Systems  Constitutional: Positive for fatigue.  Respiratory: Negative for shortness of breath.   Cardiovascular: Negative for chest pain.  Neurological: Negative for dizziness and headaches.  Psychiatric/Behavioral: The patient is nervous/anxious.        See HPI       Past Medical History:  Diagnosis Date  . Obesity      Social History   Socioeconomic History  . Marital status: Married    Spouse name: Not on file  . Number of children: Not on file  . Years of education: Not on file  . Highest education level: Not on file  Occupational History  . Not on file  Social Needs  . Financial resource  strain: Not on file  . Food insecurity    Worry: Not on file    Inability: Not on file  . Transportation needs    Medical: Not on file    Non-medical: Not on file  Tobacco Use  . Smoking status: Never Smoker  . Smokeless tobacco: Never Used  Substance and Sexual Activity  . Alcohol use: No  . Drug use: No  . Sexual activity: Yes    Birth control/protection: Other-see comments    Comment: 8 weeks postpartum  Lifestyle  . Physical activity    Days per week: Not on file    Minutes per session: Not on file  . Stress: Not on file  Relationships  . Social Musicianconnections    Talks on phone: Not on file    Gets together: Not on file    Attends religious service: Not on file    Active member of club or organization: Not on file    Attends meetings of clubs or organizations: Not on file    Relationship status: Not on file  . Intimate partner violence    Fear of current or ex partner: Not on file    Emotionally abused: Not on file    Physically abused: Not on file    Forced sexual activity: Not on file  Other Topics Concern  . Not on file  Social History Narrative   Married.   2 children.  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     Past Surgical History:  Procedure Laterality Date  . CESAREAN SECTION    . CHOLECYSTECTOMY N/A 06/22/2012   Procedure: LAPAROSCOPIC CHOLECYSTECTOMY WITH INTRAOPERATIVE CHOLANGIOGRAM;  Surgeon: Edward Jolly, MD;  Location: [facility];  Service: General;  Laterality: N/A;  . ERCP N/A 07/02/2012   Procedure: ENDOSCOPIC RETROGRADE CHOLANGIOPANCREATOGRAPHY (ERCP);  Surgeon: Inda Castle, MD;  Location: [facility];  Service: Endoscopy;  Laterality: N/A;  . ERCP N/A 07/02/2012   Procedure: ENDOSCOPIC RETROGRADE CHOLANGIOPANCREATOGRAPHY (ERCP);  Surgeon: Inda Castle, MD;  Location: [facility];  Service: Gastroenterology;  Laterality: N/A;    Family History  Problem Relation Age of Onset  . Diabetes Mellitus II Mother    . Hyperlipidemia Father   . Melanoma Maternal Grandmother   . Heart attack Maternal Grandmother   . Heart attack Paternal Grandfather     Allergies  Allergen Reactions  . Latex Swelling  . Penicillins Swelling    Current Outpatient Medications on File Prior to Visit  Medication Sig Dispense Refill  . desogestrel-ethinyl estradiol (VIORELE) 0.15-0.02/0.01 MG (21/5) tablet Take 1 tablet by mouth daily.     Marland Kitchen levothyroxine (SYNTHROID, LEVOTHROID) 25 MCG tablet TAKE 1 TABLET EVERY MORNING ON EMPTY STOMACH WITH WATER-NO FOOD OR OTHER MEDICATIONS FOR 30 MINUTES. 90 tablet 3   No current facility-administered medications on file prior to visit.     BP 116/72   Pulse 91   Temp 98.6 F (37 C) (Temporal)   Ht 5' (1.524 m)   Wt 196 lb 8 oz (89.1 kg)   SpO2 98%   BMI 38.38 kg/m    Objective:   Physical Exam  Constitutional: She appears well-nourished.  Neck: Neck supple.  Cardiovascular: Normal rate and regular rhythm.  Respiratory: Effort normal and breath sounds normal.  Skin: Skin is warm and dry.  Psychiatric: She has a normal mood and affect.  Appears anxious           Assessment & Plan:

## 2018-12-19 NOTE — Assessment & Plan Note (Signed)
Improved but also taking high doses. Suspect she is not absorbing oral B12 well given high doses. Continue with monthly IM injections for at least another 3 months. Repeat levels at that time.

## 2018-12-19 NOTE — Assessment & Plan Note (Signed)
Compliant to vitamin D, continue oral D.

## 2019-01-10 ENCOUNTER — Other Ambulatory Visit: Payer: Self-pay | Admitting: Primary Care

## 2019-01-10 DIAGNOSIS — F419 Anxiety disorder, unspecified: Secondary | ICD-10-CM

## 2019-01-10 DIAGNOSIS — F32A Depression, unspecified: Secondary | ICD-10-CM

## 2019-01-10 DIAGNOSIS — F329 Major depressive disorder, single episode, unspecified: Secondary | ICD-10-CM

## 2019-01-15 ENCOUNTER — Ambulatory Visit (INDEPENDENT_AMBULATORY_CARE_PROVIDER_SITE_OTHER): Payer: BC Managed Care – PPO

## 2019-01-15 DIAGNOSIS — E538 Deficiency of other specified B group vitamins: Secondary | ICD-10-CM | POA: Diagnosis not present

## 2019-01-15 MED ORDER — CYANOCOBALAMIN 1000 MCG/ML IJ SOLN
1000.0000 ug | Freq: Once | INTRAMUSCULAR | Status: AC
  Administered 2019-01-15: 1000 ug via INTRAMUSCULAR

## 2019-01-15 NOTE — Progress Notes (Signed)
Per orders of Dr. Duncan, injection of vit B12 given by Virgene Tirone. Patient tolerated injection well.  

## 2019-01-20 DIAGNOSIS — F32A Depression, unspecified: Secondary | ICD-10-CM

## 2019-01-20 DIAGNOSIS — F329 Major depressive disorder, single episode, unspecified: Secondary | ICD-10-CM

## 2019-01-20 DIAGNOSIS — F419 Anxiety disorder, unspecified: Secondary | ICD-10-CM

## 2019-01-21 MED ORDER — SERTRALINE HCL 25 MG PO TABS: 25.0000 mg | ORAL_TABLET | Freq: Every day | ORAL | 0 refills | Status: DC

## 2019-01-21 NOTE — Telephone Encounter (Signed)
Patient was seen on 12/19/2018. Patient supposed to follow up in 6 weeks

## 2019-01-30 ENCOUNTER — Ambulatory Visit (INDEPENDENT_AMBULATORY_CARE_PROVIDER_SITE_OTHER): Payer: BC Managed Care – PPO | Admitting: Primary Care

## 2019-01-30 ENCOUNTER — Encounter: Payer: Self-pay | Admitting: Primary Care

## 2019-01-30 DIAGNOSIS — F329 Major depressive disorder, single episode, unspecified: Secondary | ICD-10-CM | POA: Diagnosis not present

## 2019-01-30 DIAGNOSIS — F419 Anxiety disorder, unspecified: Secondary | ICD-10-CM

## 2019-01-30 DIAGNOSIS — E039 Hypothyroidism, unspecified: Secondary | ICD-10-CM | POA: Diagnosis not present

## 2019-01-30 DIAGNOSIS — F32A Depression, unspecified: Secondary | ICD-10-CM

## 2019-01-30 MED ORDER — LEVOTHYROXINE SODIUM 25 MCG PO TABS: ORAL_TABLET | ORAL | 1 refills | Status: DC

## 2019-01-30 NOTE — Progress Notes (Signed)
Subjective:    Patient ID: Consepcion Watson, female    DOB: 1990/12/13, 28 y.o.   MRN: [id number]  HPI  Virtual Visit via Video Note  I connected with Stephanie Watson on 01/30/19 at  3:20 PM EDT by a video enabled telemedicine application and verified that I am speaking with the correct person using two identifiers.  Location: Patient: Home Provider: Office   I discussed the limitations of evaluation and management by telemedicine and the availability of in person appointments. The patient expressed understanding and agreed to proceed.  History of Present Illness:  Mr. Stephanie Watson is a 28 year old female who presents today for follow up of anxiety and depression.   She was last evaluated on 12/19/18 with reports of feeling irritable, easily annoyed, fatigued. Also with daily worry, feeling stressed. GAD 7 score of 17 so we initiated Zoloft 25 mg and asked her to follow up.  Since her visit she thinks she's noticed mild improvement in irritability, but overall has found no improvement since being on Zoloft.. She continues to experience mood swings, feeling tired/fatigued, wants to be left alone at times. She denies SI/HI, GI upset.  She continues to notice her hair fall out and has gained some weight over the last month.  She is interested in trying a dose increase of her levothyroxine.  Last TSH of 3.46 in August 2020.   Observations/Objective:  Alert and oriented. Appears well, not sickly. No distress. Speaking in complete sentences.    Assessment and Plan:  See problem based charting.  Follow Up Instructions:  We have increased the dose of your levothyroxine to 50 mcg.  Since you have a new prescription filled, take 2 of the 25 mcg tablets to equal 50 for now.  Please call me if you run low before next follow-up visit.  Wean off of the Zoloft by taking 1/2 tablet daily for 1 week, then stop.  Schedule a follow-up appointment for 6 weeks.  It was a pleasure to see you  today! Stephanie Reel, NP-C    I discussed the assessment and treatment plan with the patient. The patient was provided an opportunity to ask questions and all were answered. The patient agreed with the plan and demonstrated an understanding of the instructions.   The patient was advised to call back or seek an in-person evaluation if the symptoms worsen or if the condition fails to improve as anticipated.   Stephanie Nest, NP    Review of Systems  Constitutional: Positive for fatigue.  Psychiatric/Behavioral: Negative for suicidal ideas. The patient is nervous/anxious.        See HPI       Past Medical History:  Diagnosis Date   Obesity      Social History   Socioeconomic History   Marital status: Married    Spouse name: Not on file   Number of children: Not on file   Years of education: Not on file   Highest education level: Not on file  Occupational History   Not on file  Social Needs   Financial resource strain: Not on file   Food insecurity    Worry: Not on file    Inability: Not on file   Transportation needs    Medical: Not on file    Non-medical: Not on file  Tobacco Use   Smoking status: Never Smoker   Smokeless tobacco: Never Used  Substance and Sexual Activity   Alcohol use: No   Drug  use: No   Sexual activity: Yes    Birth control/protection: Other-see comments    Comment: 8 weeks postpartum  Lifestyle   Physical activity    Days per week: Not on file    Minutes per session: Not on file   Stress: Not on file  Relationships   Social connections    Talks on phone: Not on file    Gets together: Not on file    Attends religious service: Not on file    Active member of club or organization: Not on file    Attends meetings of clubs or organizations: Not on file    Relationship status: Not on file   Intimate partner violence    Fear of current or ex partner: Not on file    Emotionally abused: Not on file    Physically abused:  Not on file    Forced sexual activity: Not on file  Other Topics Concern   Not on file  Social History Narrative   Married.   2 children.   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     Past Surgical History:  Procedure Laterality Date   CESAREAN SECTION     CHOLECYSTECTOMY N/A 06/22/2012   Procedure: LAPAROSCOPIC CHOLECYSTECTOMY WITH INTRAOPERATIVE CHOLANGIOGRAM;  Surgeon: Edward Jolly, MD;  Location: [facility];  Service: General;  Laterality: N/A;   ERCP N/A 07/02/2012   Procedure: ENDOSCOPIC RETROGRADE CHOLANGIOPANCREATOGRAPHY (ERCP);  Surgeon: Inda Castle, MD;  Location: [facility];  Service: Endoscopy;  Laterality: N/A;   ERCP N/A 07/02/2012   Procedure: ENDOSCOPIC RETROGRADE CHOLANGIOPANCREATOGRAPHY (ERCP);  Surgeon: Inda Castle, MD;  Location: [facility];  Service: Gastroenterology;  Laterality: N/A;    Family History  Problem Relation Age of Onset   Diabetes Mellitus II Mother    Hyperlipidemia Father    Melanoma Maternal Grandmother    Heart attack Maternal Grandmother    Heart attack Paternal Grandfather     Allergies  Allergen Reactions   Latex Swelling   Penicillins Swelling    Current Outpatient Medications on File Prior to Visit  Medication Sig Dispense Refill   desogestrel-ethinyl estradiol (VIORELE) 0.15-0.02/0.01 MG (21/5) tablet Take 1 tablet by mouth daily.      sertraline (ZOLOFT) 25 MG tablet Take 1 tablet (25 mg total) by mouth daily. For anxiety and depression. 90 tablet 0   No current facility-administered medications on file prior to visit.     Wt 196 lb (88.9 kg)    BMI 38.28 kg/m    Objective:   Physical Exam  Constitutional: She is oriented to person, place, and time. She appears well-nourished.  Respiratory: Effort normal.  Neurological: She is alert and oriented to person, place, and time.  Psychiatric: She has a normal mood and affect.           Assessment & Plan:

## 2019-01-30 NOTE — Assessment & Plan Note (Signed)
TSH in August 2020 within normal range but on the higher end.  Given symptoms of hair loss with continued fatigue we will increase her dose to 50 mcg to see if she feels better.  She just received a refill of 90 tablets of her 25 mcg, we will have her double her dose as cannot be wasteful.  She will call if she runs low for her next appointment, at that point we will send 50 mcg tablets.  Follow-up in the office in 6 weeks.

## 2019-01-30 NOTE — Assessment & Plan Note (Signed)
No improvement at all on Zoloft 25 mg. We will trial a dose increase of her levothyroxine, have her wean off Zoloft for now.  If anxiety symptoms persist despite increased dose of levothyroxine, will consider another SSRI for treatment.  Follow-up in 6 weeks.

## 2019-01-30 NOTE — Patient Instructions (Signed)
We have increased the dose of your levothyroxine to 50 mcg.  Since you have a new prescription filled, take 2 of the 25 mcg tablets to equal 50 for now.  Please call me if you run low before next follow-up visit.  Wean off of the Zoloft by taking 1/2 tablet daily for 1 week, then stop.  Schedule a follow-up appointment for 6 weeks.  It was a pleasure to see you today! Allie Bossier, NP-C

## 2019-02-18 ENCOUNTER — Ambulatory Visit (INDEPENDENT_AMBULATORY_CARE_PROVIDER_SITE_OTHER): Payer: BC Managed Care – PPO

## 2019-02-18 DIAGNOSIS — E538 Deficiency of other specified B group vitamins: Secondary | ICD-10-CM | POA: Diagnosis not present

## 2019-02-18 DIAGNOSIS — Z23 Encounter for immunization: Secondary | ICD-10-CM

## 2019-02-18 MED ORDER — CYANOCOBALAMIN 1000 MCG/ML IJ SOLN
1000.0000 ug | Freq: Once | INTRAMUSCULAR | Status: AC
  Administered 2019-02-18: 1000 ug via INTRAMUSCULAR

## 2019-02-18 NOTE — Progress Notes (Signed)
Per orders of Alma Friendly, NP injection of monthly B12 given by Kris Mouton. Patient tolerated injection well.  Patient also received her yearly Flu vaccine

## 2019-03-13 ENCOUNTER — Encounter: Payer: Self-pay | Admitting: Primary Care

## 2019-03-13 ENCOUNTER — Ambulatory Visit (INDEPENDENT_AMBULATORY_CARE_PROVIDER_SITE_OTHER): Payer: BC Managed Care – PPO | Admitting: Primary Care

## 2019-03-13 DIAGNOSIS — E039 Hypothyroidism, unspecified: Secondary | ICD-10-CM | POA: Diagnosis not present

## 2019-03-13 DIAGNOSIS — F329 Major depressive disorder, single episode, unspecified: Secondary | ICD-10-CM | POA: Diagnosis not present

## 2019-03-13 DIAGNOSIS — F32A Depression, unspecified: Secondary | ICD-10-CM

## 2019-03-13 DIAGNOSIS — F419 Anxiety disorder, unspecified: Secondary | ICD-10-CM

## 2019-03-13 MED ORDER — ESCITALOPRAM OXALATE 10 MG PO TABS: 10.0000 mg | ORAL_TABLET | Freq: Every day | ORAL | 1 refills | Status: DC

## 2019-03-13 NOTE — Assessment & Plan Note (Signed)
No improvement with increased dose of levothyroxine to 50 mcg. She is ready to start treatment for symptoms. Rx for Lexapro 10 mg sent to pharmacy.  Patient is to take 1/2 tablet daily for 8 days, then advance to 1 full tablet thereafter. We discussed possible side effects of headache, GI upset, drowsiness, and SI/HI. If thoughts of SI/HI develop, we discussed to present to the emergency immediately. Patient verbalized understanding.   She will update in one month via my chart.

## 2019-03-13 NOTE — Assessment & Plan Note (Signed)
Overall feeling about the same since her dose increase to 50 mcg. She is taking appropriately. Repeat TSH pending.

## 2019-03-13 NOTE — Progress Notes (Signed)
Subjective:    Patient ID: Stephanie Watson, female    DOB: [birth year], 28 y.o.   MRN: [id number]  HPI  Virtual Visit via Video Note  I connected with Stephanie Watson on 03/13/19 at  9:20 AM EST by a video enabled telemedicine application and verified that I am speaking with the correct person using two identifiers.  Location: Patient: Home Provider: Office   I discussed the limitations of evaluation and management by telemedicine and the availability of in person appointments. The patient expressed understanding and agreed to proceed.  History of Present Illness:  Stephanie Watson is a 28 year old female with a history of vitamin B 12 deficiency, hypothyroidism, anxiety and depression who presents today for follow up of anxiety and depression.  She was last evaluated on 01/30/19 for follow up of anxiety and depression. She was managed on Zoloft 25 mg at the time, hadn't noticed any improvement. She thought that her symptoms were more so secondary to hypothyroidism given hair loss with fatigue so we weaned her off of Zoloft and increased levothyroxine to 50 mcg.  Since her last visit she continues to feel fatigued, irritable, worried, and is dwelling on things. She feels as though she felt more "groggy and tired" when taking Zoloft, would like to try something else. Her sister is on Lexapro and does very well.  Observations/Objective:  Alert and oriented. Appears well, not sickly. No distress. Speaking in complete sentences.   Assessment and Plan:  See problem based charting  Follow Up Instructions:  Start escitalopram (Lexapro) 10 mg daily for anxiety. Start with 1/2 tablet daily for 6-8 days, then increase to 1 full tablet thereafter.  Schedule a lab only appointment to recheck your thyroid function.  Please update me via my chart in one month as discussed.  It was a pleasure to see you today! Stephanie Bossier, NP-C    I discussed the assessment and treatment plan with the  patient. The patient was provided an opportunity to ask questions and all were answered. The patient agreed with the plan and demonstrated an understanding of the instructions.   The patient was advised to call back or seek an in-person evaluation if the symptoms worsen or if the condition fails to improve as anticipated.    Pleas Koch, NP    Review of Systems  Constitutional: Positive for fatigue.  Cardiovascular: Negative for palpitations.  Psychiatric/Behavioral: The patient is nervous/anxious.        See HPI       Past Medical History:  Diagnosis Date   Obesity      Social History   Socioeconomic History   Marital status: Married    Spouse name: Not on file   Number of children: Not on file   Years of education: Not on file   Highest education level: Not on file  Occupational History   Not on file  Social Needs   Financial resource strain: Not on file   Food insecurity    Worry: Not on file    Inability: Not on file   Transportation needs    Medical: Not on file    Non-medical: Not on file  Tobacco Use   Smoking status: Never Smoker   Smokeless tobacco: Never Used  Substance and Sexual Activity   Alcohol use: No   Drug use: No   Sexual activity: Yes    Birth control/protection: Other-see comments    Comment: 8 weeks postpartum  Lifestyle   Physical  activity    Days per week: Not on file    Minutes per session: Not on file   Stress: Not on file  Relationships   Social connections    Talks on phone: Not on file    Gets together: Not on file    Attends religious service: Not on file    Active member of club or organization: Not on file    Attends meetings of clubs or organizations: Not on file    Relationship status: Not on file   Intimate partner violence    Fear of current or ex partner: Not on file    Emotionally abused: Not on file    Physically abused: Not on file    Forced sexual activity: Not on file  Other Topics  Concern   Not on file  Social History Narrative   Married.   2 children.   Works as a Lawyer in [employer].   Enjoys spending time with her family.     Past Surgical History:  Procedure Laterality Date   CESAREAN SECTION     CHOLECYSTECTOMY N/A 06/22/2012   Procedure: LAPAROSCOPIC CHOLECYSTECTOMY WITH INTRAOPERATIVE CHOLANGIOGRAM;  Surgeon: Mariella Saa, MD;  Location: [facility];  Service: General;  Laterality: N/A;   ERCP N/A 07/02/2012   Procedure: ENDOSCOPIC RETROGRADE CHOLANGIOPANCREATOGRAPHY (ERCP);  Surgeon: Louis Meckel, MD;  Location: [facility];  Service: Endoscopy;  Laterality: N/A;   ERCP N/A 07/02/2012   Procedure: ENDOSCOPIC RETROGRADE CHOLANGIOPANCREATOGRAPHY (ERCP);  Surgeon: Louis Meckel, MD;  Location: [facility];  Service: Gastroenterology;  Laterality: N/A;    Family History  Problem Relation Age of Onset   Diabetes Mellitus II Mother    Hyperlipidemia Father    Melanoma Maternal Grandmother    Heart attack Maternal Grandmother    Heart attack Paternal Grandfather     Allergies  Allergen Reactions   Latex Swelling   Penicillins Swelling    Current Outpatient Medications on File Prior to Visit  Medication Sig Dispense Refill   desogestrel-ethinyl estradiol (VIORELE) 0.15-0.02/0.01 MG (21/5) tablet Take 1 tablet by mouth daily.      levothyroxine (SYNTHROID) 25 MCG tablet TAKE 1 TABLET EVERY MORNING ON EMPTY STOMACH WITH WATER-NO FOOD OR OTHER MEDICATIONS FOR 30 MINUTES. 60 tablet 1   No current facility-administered medications on file prior to visit.     BP 115/64    Wt 202 lb (91.6 kg)    BMI 39.45 kg/m    Objective:   Physical Exam  Constitutional: She is oriented to person, place, and time. She appears well-nourished.  Respiratory: Effort normal.  Neurological: She is alert and oriented to person, place, and time.  Psychiatric: She has a normal mood and affect.           Assessment & Plan:

## 2019-03-13 NOTE — Patient Instructions (Signed)
Start escitalopram (Lexapro) 10 mg daily for anxiety. Start with 1/2 tablet daily for 6-8 days, then increase to 1 full tablet thereafter.  Schedule a lab only appointment to recheck your thyroid function.  Please update me via my chart in one month as discussed.  It was a pleasure to see you today! Allie Bossier, NP-C

## 2019-03-18 ENCOUNTER — Other Ambulatory Visit: Payer: Self-pay | Admitting: Primary Care

## 2019-03-18 DIAGNOSIS — E538 Deficiency of other specified B group vitamins: Secondary | ICD-10-CM

## 2019-03-25 ENCOUNTER — Ambulatory Visit (INDEPENDENT_AMBULATORY_CARE_PROVIDER_SITE_OTHER): Payer: BC Managed Care – PPO

## 2019-03-25 ENCOUNTER — Other Ambulatory Visit (INDEPENDENT_AMBULATORY_CARE_PROVIDER_SITE_OTHER): Payer: BC Managed Care – PPO

## 2019-03-25 DIAGNOSIS — E538 Deficiency of other specified B group vitamins: Secondary | ICD-10-CM

## 2019-03-25 DIAGNOSIS — E039 Hypothyroidism, unspecified: Secondary | ICD-10-CM | POA: Diagnosis not present

## 2019-03-25 MED ORDER — CYANOCOBALAMIN 1000 MCG/ML IJ SOLN
1000.0000 ug | Freq: Once | INTRAMUSCULAR | Status: AC
  Administered 2019-03-25: 1000 ug via INTRAMUSCULAR

## 2019-03-25 NOTE — Progress Notes (Signed)
Pt received monthly B12 injection in right deltoid after she had labs done. Tolerated well.

## 2019-03-26 ENCOUNTER — Telehealth: Payer: Self-pay

## 2019-03-26 DIAGNOSIS — E039 Hypothyroidism, unspecified: Secondary | ICD-10-CM

## 2019-03-26 LAB — VITAMIN B12: Vitamin B-12: 365 pg/mL (ref 211–911)

## 2019-03-26 LAB — TSH: TSH: 1.72 u[IU]/mL (ref 0.35–4.50)

## 2019-03-26 MED ORDER — LEVOTHYROXINE SODIUM 50 MCG PO TABS: ORAL_TABLET | ORAL | 3 refills | Status: DC

## 2019-03-26 NOTE — Telephone Encounter (Signed)
Left message on patient's voicemail to please call back and speak directly with me regarding need for lab visit.   Thanks.

## 2019-04-03 ENCOUNTER — Other Ambulatory Visit: Payer: Self-pay | Admitting: Primary Care

## 2019-04-03 DIAGNOSIS — F329 Major depressive disorder, single episode, unspecified: Secondary | ICD-10-CM

## 2019-04-03 DIAGNOSIS — F419 Anxiety disorder, unspecified: Secondary | ICD-10-CM

## 2019-04-03 DIAGNOSIS — F32A Depression, unspecified: Secondary | ICD-10-CM

## 2019-04-04 ENCOUNTER — Other Ambulatory Visit: Payer: Self-pay | Admitting: Primary Care

## 2019-04-04 DIAGNOSIS — F32A Depression, unspecified: Secondary | ICD-10-CM

## 2019-04-04 DIAGNOSIS — F329 Major depressive disorder, single episode, unspecified: Secondary | ICD-10-CM

## 2019-04-07 DIAGNOSIS — F329 Major depressive disorder, single episode, unspecified: Secondary | ICD-10-CM

## 2019-04-07 DIAGNOSIS — F32A Depression, unspecified: Secondary | ICD-10-CM

## 2019-04-07 DIAGNOSIS — F419 Anxiety disorder, unspecified: Secondary | ICD-10-CM

## 2019-04-07 MED ORDER — ESCITALOPRAM OXALATE 10 MG PO TABS: 10.0000 mg | ORAL_TABLET | Freq: Every day | ORAL | 1 refills | Status: DC

## 2019-04-29 ENCOUNTER — Ambulatory Visit (INDEPENDENT_AMBULATORY_CARE_PROVIDER_SITE_OTHER): Payer: BC Managed Care – PPO

## 2019-04-29 ENCOUNTER — Other Ambulatory Visit: Payer: Self-pay

## 2019-04-29 DIAGNOSIS — E538 Deficiency of other specified B group vitamins: Secondary | ICD-10-CM

## 2019-04-29 MED ORDER — CYANOCOBALAMIN 1000 MCG/ML IJ SOLN
1000.0000 ug | Freq: Once | INTRAMUSCULAR | Status: AC
  Administered 2019-04-29: 1000 ug via INTRAMUSCULAR

## 2019-04-29 NOTE — Progress Notes (Signed)
Pt given monthly B12 injection in Right Deltoid. Tolerated well.

## 2019-06-02 ENCOUNTER — Other Ambulatory Visit: Payer: Self-pay | Admitting: Primary Care

## 2019-06-02 ENCOUNTER — Telehealth: Payer: Self-pay

## 2019-06-02 DIAGNOSIS — E538 Deficiency of other specified B group vitamins: Secondary | ICD-10-CM

## 2019-06-02 DIAGNOSIS — E039 Hypothyroidism, unspecified: Secondary | ICD-10-CM

## 2019-06-02 NOTE — Telephone Encounter (Signed)
Pt has apt tomorrow for B12. Tried to contact to screen for covid. LVM

## 2019-06-03 ENCOUNTER — Ambulatory Visit (INDEPENDENT_AMBULATORY_CARE_PROVIDER_SITE_OTHER): Payer: BC Managed Care – PPO

## 2019-06-03 ENCOUNTER — Other Ambulatory Visit (INDEPENDENT_AMBULATORY_CARE_PROVIDER_SITE_OTHER): Payer: BC Managed Care – PPO

## 2019-06-03 ENCOUNTER — Other Ambulatory Visit: Payer: Self-pay

## 2019-06-03 DIAGNOSIS — E538 Deficiency of other specified B group vitamins: Secondary | ICD-10-CM | POA: Diagnosis not present

## 2019-06-03 DIAGNOSIS — E039 Hypothyroidism, unspecified: Secondary | ICD-10-CM | POA: Diagnosis not present

## 2019-06-03 MED ORDER — CYANOCOBALAMIN 1000 MCG/ML IJ SOLN
1000.0000 ug | Freq: Once | INTRAMUSCULAR | Status: AC
  Administered 2019-06-03: 1000 ug via INTRAMUSCULAR

## 2019-06-03 NOTE — Progress Notes (Signed)
Per orders of Katherine Clark, NP injection of monthly B12 given by Manjit Bufano V Dema Timmons. Patient tolerated injection well.  

## 2019-06-04 ENCOUNTER — Other Ambulatory Visit: Payer: Self-pay | Admitting: Primary Care

## 2019-06-04 DIAGNOSIS — E039 Hypothyroidism, unspecified: Secondary | ICD-10-CM

## 2019-06-04 LAB — VITAMIN B12: Vitamin B-12: 264 pg/mL (ref 211–911)

## 2019-06-04 LAB — TSH: TSH: 2.32 u[IU]/mL (ref 0.35–4.50)

## 2019-06-04 MED ORDER — CYANOCOBALAMIN 1000 MCG/ML IJ SOLN: INTRAMUSCULAR | 0 refills | Status: DC

## 2019-06-04 MED ORDER — "SYRINGE/NEEDLE (DISP) 23G X 1"" 1 ML MISC": 0 refills | Status: DC

## 2019-06-04 NOTE — Telephone Encounter (Signed)
Cancel Rx at [facility] and resent to [facility]

## 2019-06-04 NOTE — Telephone Encounter (Signed)
Johny Drilling, I'm sorry but can you cancel the cyanocobalamin and syringe prescription and send to [facility]?

## 2019-06-05 ENCOUNTER — Ambulatory Visit (INDEPENDENT_AMBULATORY_CARE_PROVIDER_SITE_OTHER): Payer: BC Managed Care – PPO | Admitting: Primary Care

## 2019-06-05 ENCOUNTER — Other Ambulatory Visit: Payer: Self-pay

## 2019-06-05 DIAGNOSIS — F419 Anxiety disorder, unspecified: Secondary | ICD-10-CM

## 2019-06-05 DIAGNOSIS — E039 Hypothyroidism, unspecified: Secondary | ICD-10-CM | POA: Diagnosis not present

## 2019-06-05 DIAGNOSIS — R5383 Other fatigue: Secondary | ICD-10-CM | POA: Diagnosis not present

## 2019-06-05 DIAGNOSIS — F32A Depression, unspecified: Secondary | ICD-10-CM

## 2019-06-05 DIAGNOSIS — E538 Deficiency of other specified B group vitamins: Secondary | ICD-10-CM | POA: Diagnosis not present

## 2019-06-05 DIAGNOSIS — F329 Major depressive disorder, single episode, unspecified: Secondary | ICD-10-CM

## 2019-06-05 MED ORDER — BUPROPION HCL ER (XL) 150 MG PO TB24: 150.0000 mg | ORAL_TABLET | Freq: Every day | ORAL | 0 refills | Status: DC

## 2019-06-05 NOTE — Assessment & Plan Note (Signed)
Chronic and seems to be progressing.  Labs stable, albeit B12 is still low normal. Will be treating B12 more aggressively. TSH is stable. She could be experiencing sleep apnea given body habitus and snoring, will keep this on the differentials list. Could be secondary to Lexapro but she felt this way when taking Zoloft as well.  Will add in Wellbutrin XL 150 mg daily and follow up with her in 6 weeks.

## 2019-06-05 NOTE — Patient Instructions (Signed)
Continue Lexapro 10 mg once nightly for anxiety and depression.  Start bupropion (Wellbutrin) XL 150 mg once daily for anxiety and depression.  Start B12 injections weekly for one month, then every other week for two months, then monthly thereafter.  Please schedule a follow up visit for 6 weeks as discussed.   It was a pleasure to see you today! [name], NP-C

## 2019-06-05 NOTE — Assessment & Plan Note (Signed)
Recent TSH stable on 50 mcg dose. She is taking correctly. Continue levothyroxine 50 mcg.

## 2019-06-05 NOTE — Progress Notes (Signed)
Subjective:    Patient ID: Stephanie Watson, female    DOB: 13-Jan-1991, 29 y.o.   MRN: [id number]  HPI  Virtual Visit via Video Note  I connected with Stephanie Watson on 06/05/19 at  9:40 AM EST by a video enabled telemedicine application and verified that I am speaking with the correct person using two identifiers.  Location: Patient: Home Provider: Office   I discussed the limitations of evaluation and management by telemedicine and the availability of in person appointments. The patient expressed understanding and agreed to proceed.  History of Present Illness:  Stephanie Watson is a 29 year old female with a history of hypothyroidism, vitamin B12 deficiency, anxiety and depression who presents today for follow up.  1) Hypothyroidism: Currently managed on levothyroxine 50 mcg. Recent TSH of 2.32. She is taking her levothyroxine every morning with water only, no food or other medications for 30 minutes.  2) B12 Deficiency: Chronic and on monthly B12 injections. Recent B12 level of 264 despite monthly injections. Yesterday we sent a prescription for weekly B12 injections x 1 month, then every other week for 2 months. She will get this through mail order, has someone at home to provide injections safely.   3) Anxiety and Depression: Currently managed on Lexapro 10 mg for which she's been taking since early November 2020, didn't feel as though Zoloft was effective.  Since her last visit she's noticed some improvement in her mood. She's feeling more calm during stressful situations, more calm with her children, less irritability. She continues to feel fatigued, some irritability, but her main concern is feeling depleted of energy. She's sleeping well. She feels like she can fall asleep at any moment including when resting on the couch, has felt this way while driving and stopped at a stoplight. She has no energy when getting home from work.   She takes her Lexapro at night. She does snore a lot  during the night per her husband. She's not sure if she stops breathing during the night, doesn't really wake during the night. She denies family history of sleep apnea. She has put on 20 pounds over the last several months.    Observations/Objective:  Alert and oriented. Appears well, not sickly. No distress. Speaking in complete sentences.   Assessment and Plan:  See problem based charting.  Follow Up Instructions:  Continue Lexapro 10 mg once nightly for anxiety and depression.  Start bupropion (Wellbutrin) XL 150 mg once daily for anxiety and depression.  Start B12 injections weekly for one month, then every other week for two months, then monthly thereafter.  Please schedule a follow up visit for 6 weeks as discussed.   It was a pleasure to see you today! [name], NP-C    I discussed the assessment and treatment plan with the patient. The patient was provided an opportunity to ask questions and all were answered. The patient agreed with the plan and demonstrated an understanding of the instructions.   The patient was advised to call back or seek an in-person evaluation if the symptoms worsen or if the condition fails to improve as anticipated.    Doreene Nest, NP    Review of Systems  Constitutional: Positive for fatigue.  Respiratory: Negative for shortness of breath.        Snoring at night  Cardiovascular: Negative for chest pain.  Psychiatric/Behavioral:       See HPI       Past Medical History:  Diagnosis  Date  . Obesity      Social History   Socioeconomic History  . Marital status: Married    Spouse name: Not on file  . Number of children: Not on file  . Years of education: Not on file  . Highest education level: Not on file  Occupational History  . Not on file  Tobacco Use  . Smoking status: Never Smoker  . Smokeless tobacco: Never Used  Substance and Sexual Activity  . Alcohol use: No  . Drug use: No  . Sexual activity: Yes     Birth control/protection: Other-see comments    Comment: 8 weeks postpartum  Other Topics Concern  . Not on file  Social History Narrative   Married.   2 children.   Works as a Lawyer in [employer].   Enjoys spending time with her family.    Social Determinants of Health   Financial Resource Strain:   . Difficulty of Paying Living Expenses: Not on file  Food Insecurity:   . Worried About Programme researcher, broadcasting/film/video in the Last Year: Not on file  . Ran Out of Food in the Last Year: Not on file  Transportation Needs:   . Lack of Transportation (Medical): Not on file  . Lack of Transportation (Non-Medical): Not on file  Physical Activity:   . Days of Exercise per Week: Not on file  . Minutes of Exercise per Session: Not on file  Stress:   . Feeling of Stress : Not on file  Social Connections:   . Frequency of Communication with Friends and Family: Not on file  . Frequency of Social Gatherings with Friends and Family: Not on file  . Attends Religious Services: Not on file  . Active Member of Clubs or Organizations: Not on file  . Attends Banker Meetings: Not on file  . Marital Status: Not on file  Intimate Partner Violence:   . Fear of Current or Ex-Partner: Not on file  . Emotionally Abused: Not on file  . Physically Abused: Not on file  . Sexually Abused: Not on file    Past Surgical History:  Procedure Laterality Date  . CESAREAN SECTION    . CHOLECYSTECTOMY N/A 06/22/2012   Procedure: LAPAROSCOPIC CHOLECYSTECTOMY WITH INTRAOPERATIVE CHOLANGIOGRAM;  Surgeon: Mariella Saa, MD;  Location: [facility];  Service: General;  Laterality: N/A;  . ERCP N/A 07/02/2012   Procedure: ENDOSCOPIC RETROGRADE CHOLANGIOPANCREATOGRAPHY (ERCP);  Surgeon: Louis Meckel, MD;  Location: [facility];  Service: Endoscopy;  Laterality: N/A;  . ERCP N/A 07/02/2012   Procedure: ENDOSCOPIC RETROGRADE CHOLANGIOPANCREATOGRAPHY (ERCP);  Surgeon: Louis Meckel, MD;  Location: [facility];  Service:  Gastroenterology;  Laterality: N/A;    Family History  Problem Relation Age of Onset  . Diabetes Mellitus II Mother   . Hyperlipidemia Father   . Melanoma Maternal Grandmother   . Heart attack Maternal Grandmother   . Heart attack Paternal Grandfather     Allergies  Allergen Reactions  . Latex Swelling  . Penicillins Swelling    Current Outpatient Medications on File Prior to Visit  Medication Sig Dispense Refill  . cyanocobalamin (,VITAMIN B-12,) 1000 MCG/ML injection Inject 34ml once weekly for 4 weeks, then every other week for 8 weeks. 10 mL 0  . desogestrel-ethinyl estradiol (VIORELE) 0.15-0.02/0.01 MG (21/5) tablet Take 1 tablet by mouth daily.     Marland Kitchen escitalopram (LEXAPRO) 10 MG tablet Take 1 tablet (10 mg total) by mouth daily. For anxiety and depression.  90 tablet 1  . levothyroxine (SYNTHROID) 50 MCG tablet Take 1 tablet by mouth every morning on an empty stomach with water only.  No food or other medications for 30 minutes. 90 tablet 3  . SYRINGE/NEEDLE, DISP, 1 ML 23G X 1" 1 ML MISC Use as directed 8 each 0   No current facility-administered medications on file prior to visit.    There were no vitals taken for this visit.   Objective:   Physical Exam  Constitutional: She is oriented to person, place, and time. She appears well-nourished.  Respiratory: Effort normal.  Neurological: She is alert and oriented to person, place, and time.  Psychiatric: She has a normal mood and affect.           Assessment & Plan:

## 2019-06-05 NOTE — Assessment & Plan Note (Signed)
Seems improved overall on Lexapro, does still struggle with fatigue.  Labs stable, albeit B12 is still low normal. Will be treating B12 more aggressively. TSH is stable. She could be experiencing sleep apnea given body habitus and snoring, will keep this on the differentials list.   Add Wellbutrin XL 150 mg once daily for symptoms. follow up in 6 weeks.

## 2019-06-05 NOTE — Assessment & Plan Note (Signed)
Levels overall improved but still lower end of normal. Will boost her with weekly injections x 1 month, then every other week x 2 months, then monthly.  Rx sent to [facility].

## 2019-06-09 ENCOUNTER — Telehealth: Payer: Self-pay

## 2019-06-09 NOTE — Telephone Encounter (Signed)
I have called [facility] and notified that it is okay to give 3 ml syringe instead of 1 ml syringe

## 2019-06-09 NOTE — Telephone Encounter (Signed)
[facility] (no name was left of person calling); [facility] question of why there was an order for syringe with needle requested on 06/04/19. [facility] said pt was not sure why syringe was ordered since getting B 12 shots at [facility]. [facility] request cb using ref # [id number]. See 06/03/19 pt message.

## 2019-07-16 ENCOUNTER — Ambulatory Visit (INDEPENDENT_AMBULATORY_CARE_PROVIDER_SITE_OTHER): Payer: BC Managed Care – PPO | Admitting: Primary Care

## 2019-07-16 DIAGNOSIS — E538 Deficiency of other specified B group vitamins: Secondary | ICD-10-CM

## 2019-07-16 DIAGNOSIS — F329 Major depressive disorder, single episode, unspecified: Secondary | ICD-10-CM

## 2019-07-16 DIAGNOSIS — Z3041 Encounter for surveillance of contraceptive pills: Secondary | ICD-10-CM | POA: Diagnosis not present

## 2019-07-16 DIAGNOSIS — E039 Hypothyroidism, unspecified: Secondary | ICD-10-CM | POA: Diagnosis not present

## 2019-07-16 DIAGNOSIS — F419 Anxiety disorder, unspecified: Secondary | ICD-10-CM

## 2019-07-16 DIAGNOSIS — F32A Depression, unspecified: Secondary | ICD-10-CM

## 2019-07-16 DIAGNOSIS — R5383 Other fatigue: Secondary | ICD-10-CM | POA: Diagnosis not present

## 2019-07-16 MED ORDER — DESOGESTREL-ETHINYL ESTRADIOL 0.15-0.02/0.01 MG (21/5) PO TABS: 1.0000 | ORAL_TABLET | Freq: Every day | ORAL | 3 refills | Status: DC

## 2019-07-16 NOTE — Patient Instructions (Signed)
Call the main line to schedule a lab appointment for next month.  Please have your GYN office fax over records.   Continue weekly B12 injections for another 4 weeks, please update me via my chart as discussed.  It was a pleasure to see you today! [name], NP-C

## 2019-07-16 NOTE — Assessment & Plan Note (Signed)
Wanting to follow with [place] now for women's health. Agree to take over her OCP's. She will have last pap results faxed over.

## 2019-07-16 NOTE — Assessment & Plan Note (Signed)
Improving with weekly B12 injections, also improved mood with Wellbutrin in addition to Lexapro.  Continue weekly B12 injections x 4 weeks. Repeat B12 in 4 weeks.  Continue to monitor. She will update.

## 2019-07-16 NOTE — Assessment & Plan Note (Signed)
Fatigue improving with weekly B12 injections. Given that she's improving but still "crashing" towards the end of the week we will continue another 4 weeks of weekly injections.   She will update in one month. Repeat B12 in one month.

## 2019-07-16 NOTE — Assessment & Plan Note (Signed)
Taking levothyroxine appropriately.  Continue current 50 mcg dose.

## 2019-07-16 NOTE — Progress Notes (Signed)
Subjective:    Patient ID: [name], female    DOB: 04/27/1991, 29 y.o.   MRN: [id number]  HPI  Virtual Visit via Video Note  I connected with Stephanie Watson on 07/16/19 at 10:40 AM EST by a video enabled telemedicine application and verified that I am speaking with the correct person using two identifiers.  Location: Patient: Work Provider: Office   I discussed the limitations of evaluation and management by telemedicine and the availability of in person appointments. The patient expressed understanding and agreed to proceed.  History of Present Illness:  Stephanie Watson is a 29 year old female with a history of vitamin B12 deficiency, hypothyroidism, GAD, vitamin D deficiency who presents today for follow up. She is also needing a refill of her birth control pills.  Previously following with [facility]YN, would like for [place] to take over OCP refills. Last pap smear was in 2019 per patient. We do not have those records.   Managed on B12 injections 1000 mcg weekly. Last B12 level of 264 in late January 2021. She's been doing B12 injections once weekly for the last 4 weeks and will be going to every other week starting next week. She would like to continue with weekly injections as she has noticed that she "crashes" around Thursday. Overall has noticed gradual improvement in fatigue since weekly B12 injections.   Managed on levothyroxine 50 mcg, TSH of 2.32 in late January 2021. She is taking levothyroxine every morning on an empty stomach with water only. No food for other medications for 30 min.  Managed on Lexapro 10 mg and now Wellbutrin XL 150 mg (initiated last visit). She has noticed an improvement in mood and anxiety since addition of Wellbutrin XL was added. Feels more calm, less down. Does have some irritability and fatigue.    Observations/Objective:  Alert and oriented. Appears well, not sickly. No distress. Speaking in complete sentences.   Assessment and  Plan:  See problem based charting.   Follow Up Instructions:  Call the main line to schedule a lab appointment for next month.  Please have your GYN office fax over records.   Continue weekly B12 injections for another 4 weeks, please update me via my chart as discussed.  It was a pleasure to see you today! Stephanie Reel, NP-C    I discussed the assessment and treatment plan with the patient. The patient was provided an opportunity to ask questions and all were answered. The patient agreed with the plan and demonstrated an understanding of the instructions.   The patient was advised to call back or seek an in-person evaluation if the symptoms worsen or if the condition fails to improve as anticipated.   Stephanie Nest, NP    Review of Systems  Constitutional: Positive for fatigue.       Improving fatigue  Respiratory:       Snoring   Psychiatric/Behavioral:       Improved mood, see HPI       Past Medical History:  Diagnosis Date  . Obesity      Social History   Socioeconomic History  . Marital status: Married    Spouse name: Not on file  . Number of children: Not on file  . Years of education: Not on file  . Highest education level: Not on file  Occupational History  . Not on file  Tobacco Use  . Smoking status: Never Smoker  . Smokeless tobacco: Never Used  Substance and Sexual Activity  . Alcohol use: No  . Drug use: No  . Sexual activity: Yes    Birth control/protection: Other-see comments    Comment: 8 weeks postpartum  Other Topics Concern  . Not on file  Social History Narrative   Married.   2 children.   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    Social Determinants of Health   Financial Resource Strain:   . Difficulty of Paying Living Expenses:   Food Insecurity:   . Worried About Charity fundraiser in the Last Year:   . Arboriculturist in the Last Year:   Transportation Needs:   . Lexicographer (Medical):   Marland Kitchen Lack of Transportation (Non-Medical):   Physical Activity:   . Days of Exercise per Week:   . Minutes of Exercise per Session:   Stress:   . Feeling of Stress :   Social Connections:   . Frequency of Communication with Friends and Family:   . Frequency of Social Gatherings with Friends and Family:   . Attends Religious Services:   . Active Member of Clubs or Organizations:   . Attends Archivist Meetings:   Marland Kitchen Marital Status:   Intimate Partner Violence:   . Fear of Current or Ex-Partner:   . Emotionally Abused:   Marland Kitchen Physically Abused:   . Sexually Abused:     Past Surgical History:  Procedure Laterality Date  . CESAREAN SECTION    . CHOLECYSTECTOMY N/A 06/22/2012   Procedure: LAPAROSCOPIC CHOLECYSTECTOMY WITH INTRAOPERATIVE CHOLANGIOGRAM;  Surgeon: Edward Jolly, MD;  Location: [facility];  Service: General;  Laterality: N/A;  . ERCP N/A 07/02/2012   Procedure: ENDOSCOPIC RETROGRADE CHOLANGIOPANCREATOGRAPHY (ERCP);  Surgeon: Inda Castle, MD;  Location: [facility];  Service: Endoscopy;  Laterality: N/A;  . ERCP N/A 07/02/2012   Procedure: ENDOSCOPIC RETROGRADE CHOLANGIOPANCREATOGRAPHY (ERCP);  Surgeon: Inda Castle, MD;  Location: [facility];  Service: Gastroenterology;  Laterality: N/A;    Family History  Problem Relation Age of Onset  . Diabetes Mellitus II Mother   . Hyperlipidemia Father   . Melanoma Maternal Grandmother   . Heart attack Maternal Grandmother   . Heart attack Paternal Grandfather     Allergies  Allergen Reactions  . Latex Swelling  . Penicillins Swelling    Current Outpatient Medications on File Prior to Visit  Medication Sig Dispense Refill  . buPROPion (WELLBUTRIN XL) 150 MG 24 hr tablet Take 1 tablet (150 mg total) by mouth daily. For anxiety and depression. 90 tablet 0  . cyanocobalamin (,VITAMIN B-12,) 1000 MCG/ML injection Inject 21ml once weekly for 4 weeks, then every other week for 8 weeks. 10 mL 0   . desogestrel-ethinyl estradiol (VIORELE) 0.15-0.02/0.01 MG (21/5) tablet Take 1 tablet by mouth daily.     Marland Kitchen escitalopram (LEXAPRO) 10 MG tablet Take 1 tablet (10 mg total) by mouth daily. For anxiety and depression. 90 tablet 1  . levothyroxine (SYNTHROID) 50 MCG tablet Take 1 tablet by mouth every morning on an empty stomach with water only.  No food or other medications for 30 minutes. 90 tablet 3  . SYRINGE/NEEDLE, DISP, 1 ML 23G X 1" 1 ML MISC Use as directed 8 each 0   No current facility-administered medications on file prior to visit.    There were no vitals taken for this visit.   Objective:   Physical Exam  Constitutional: She  is oriented to person, place, and time. She appears well-nourished.  Respiratory: Effort normal.  Neurological: She is alert and oriented to person, place, and time.  Psychiatric: She has a normal mood and affect.           Assessment & Plan:

## 2019-07-16 NOTE — Assessment & Plan Note (Signed)
Mood improved since we added Wellbutrin to Lexapro. Continue same.

## 2019-08-14 ENCOUNTER — Other Ambulatory Visit: Payer: Self-pay | Admitting: Primary Care

## 2019-08-14 ENCOUNTER — Other Ambulatory Visit: Payer: Self-pay

## 2019-08-14 ENCOUNTER — Other Ambulatory Visit (INDEPENDENT_AMBULATORY_CARE_PROVIDER_SITE_OTHER): Payer: BC Managed Care – PPO

## 2019-08-14 DIAGNOSIS — E039 Hypothyroidism, unspecified: Secondary | ICD-10-CM | POA: Diagnosis not present

## 2019-08-14 DIAGNOSIS — E538 Deficiency of other specified B group vitamins: Secondary | ICD-10-CM | POA: Diagnosis not present

## 2019-08-14 DIAGNOSIS — E559 Vitamin D deficiency, unspecified: Secondary | ICD-10-CM | POA: Diagnosis not present

## 2019-08-14 LAB — VITAMIN D 25 HYDROXY (VIT D DEFICIENCY, FRACTURES): Vit D, 25-Hydroxy: 19 ng/mL — ABNORMAL LOW (ref 30–100)

## 2019-08-14 LAB — TSH: TSH: 1.02 m[IU]/L

## 2019-08-14 LAB — VITAMIN B12: Vitamin B-12: 814 pg/mL (ref 200–1100)

## 2019-08-14 NOTE — Addendum Note (Signed)
Addended by: Aquilla Solian on: 08/14/2019 04:05 PM   Modules accepted: Orders

## 2019-08-15 ENCOUNTER — Other Ambulatory Visit: Payer: Self-pay | Admitting: Primary Care

## 2019-08-15 DIAGNOSIS — E538 Deficiency of other specified B group vitamins: Secondary | ICD-10-CM

## 2019-08-15 DIAGNOSIS — E559 Vitamin D deficiency, unspecified: Secondary | ICD-10-CM

## 2019-08-16 ENCOUNTER — Other Ambulatory Visit: Payer: Self-pay | Admitting: Primary Care

## 2019-08-16 DIAGNOSIS — F32A Depression, unspecified: Secondary | ICD-10-CM

## 2019-08-16 DIAGNOSIS — F329 Major depressive disorder, single episode, unspecified: Secondary | ICD-10-CM

## 2019-08-17 NOTE — Telephone Encounter (Signed)
Last prescribed on 06/04/2019 . Last appointment on 07/16/2019. No future appointment

## 2019-08-17 NOTE — Telephone Encounter (Signed)
Also sent refill of the B12 to you

## 2019-08-18 NOTE — Telephone Encounter (Signed)
See my chart message, refill sent to pharmacy

## 2019-08-25 ENCOUNTER — Ambulatory Visit (INDEPENDENT_AMBULATORY_CARE_PROVIDER_SITE_OTHER): Payer: BC Managed Care – PPO | Admitting: Family Medicine

## 2019-08-25 ENCOUNTER — Other Ambulatory Visit: Payer: Self-pay

## 2019-08-25 ENCOUNTER — Other Ambulatory Visit: Payer: Self-pay | Admitting: Family Medicine

## 2019-08-25 ENCOUNTER — Ambulatory Visit (INDEPENDENT_AMBULATORY_CARE_PROVIDER_SITE_OTHER)
Admission: RE | Admit: 2019-08-25 | Discharge: 2019-08-25 | Disposition: A | Discharge status: Home / Self Care | Payer: BC Managed Care – PPO | Source: Ambulatory Visit | Attending: Family Medicine | Admitting: Family Medicine

## 2019-08-25 ENCOUNTER — Encounter: Payer: Self-pay | Admitting: Family Medicine

## 2019-08-25 DIAGNOSIS — M25521 Pain in right elbow: Secondary | ICD-10-CM

## 2019-08-25 DIAGNOSIS — S52121A Displaced fracture of head of right radius, initial encounter for closed fracture: Secondary | ICD-10-CM | POA: Diagnosis not present

## 2019-08-25 DIAGNOSIS — S52124A Nondisplaced fracture of head of right radius, initial encounter for closed fracture: Secondary | ICD-10-CM | POA: Diagnosis not present

## 2019-08-25 MED ORDER — DICLOFENAC SODIUM 75 MG PO TBEC: 75.0000 mg | DELAYED_RELEASE_TABLET | Freq: Two times a day (BID) | ORAL | 0 refills | Status: DC
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
## 2019-08-25 NOTE — Assessment & Plan Note (Signed)
Traumatic, likely bone bruise, tendonitis. X-ray appears negative. We will call with final X-ray results.  Ice and elevate arm.  Start diclofenac 75 mg twice daily for pain and inflammation.  Start gentle range of motion.  Follow up if not improving in 2 weeks with sports medicine MD Dr. Patsy Lager.

## 2019-08-25 NOTE — Patient Instructions (Addendum)
We will call with final X-ray results.  Ice and elevate arm.  Start diclofenac 75 mg twice daily for pain and inflammation.  Start gentle range of motion.  Follow up if not improving in 2 weeks with sports medicine MD Dr. Patsy Lager.

## 2019-08-27 DIAGNOSIS — S52124A Nondisplaced fracture of head of right radius, initial encounter for closed fracture: Secondary | ICD-10-CM | POA: Diagnosis not present

## 2019-09-03 ENCOUNTER — Other Ambulatory Visit: Payer: Self-pay

## 2019-09-03 DIAGNOSIS — E538 Deficiency of other specified B group vitamins: Secondary | ICD-10-CM

## 2019-09-04 MED ORDER — "SYRINGE/NEEDLE (DISP) 23G X 1"" 1 ML MISC": 0 refills | Status: DC

## 2019-09-16 ENCOUNTER — Other Ambulatory Visit: Payer: Self-pay | Admitting: Primary Care

## 2019-09-16 DIAGNOSIS — F32A Depression, unspecified: Secondary | ICD-10-CM

## 2019-11-10 ENCOUNTER — Other Ambulatory Visit: Payer: Self-pay | Admitting: Primary Care

## 2019-11-10 DIAGNOSIS — E538 Deficiency of other specified B group vitamins: Secondary | ICD-10-CM

## 2019-11-10 NOTE — Telephone Encounter (Signed)
Last prescribed on 08/18/2019 Last OV (follow up) with [name] on 07/16/2019  No future OV scheduled

## 2019-11-10 NOTE — Telephone Encounter (Signed)
Needs repeat vitamin D and B12 labs as mentioned to patient in my chart message o on August 17, 2019.  Will refuse refill request at this time.

## 2019-11-11 NOTE — Telephone Encounter (Signed)
Schedule lab appt on 11/12/2019

## 2019-11-12 ENCOUNTER — Other Ambulatory Visit (INDEPENDENT_AMBULATORY_CARE_PROVIDER_SITE_OTHER): Payer: BC Managed Care – PPO

## 2019-11-12 ENCOUNTER — Other Ambulatory Visit: Payer: Self-pay

## 2019-11-12 DIAGNOSIS — E538 Deficiency of other specified B group vitamins: Secondary | ICD-10-CM

## 2019-11-12 DIAGNOSIS — E559 Vitamin D deficiency, unspecified: Secondary | ICD-10-CM

## 2019-11-13 LAB — VITAMIN D 25 HYDROXY (VIT D DEFICIENCY, FRACTURES): VITD: 35.49 ng/mL (ref 30.00–100.00)

## 2019-11-13 LAB — VITAMIN B12: Vitamin B-12: 1011 pg/mL — ABNORMAL HIGH (ref 211–911)

## 2020-02-03 ENCOUNTER — Ambulatory Visit: Payer: BC Managed Care – PPO | Admitting: Primary Care

## 2020-02-04 ENCOUNTER — Encounter: Payer: Self-pay | Admitting: Primary Care

## 2020-02-04 ENCOUNTER — Ambulatory Visit (INDEPENDENT_AMBULATORY_CARE_PROVIDER_SITE_OTHER): Payer: BC Managed Care – PPO | Admitting: Primary Care

## 2020-02-04 ENCOUNTER — Other Ambulatory Visit: Payer: Self-pay

## 2020-02-04 VITALS — BP 110/62 | HR 91 | Temp 98.5°F | Ht 60.0 in | Wt 223.0 lb

## 2020-02-04 DIAGNOSIS — Z23 Encounter for immunization: Secondary | ICD-10-CM

## 2020-02-04 DIAGNOSIS — F32A Depression, unspecified: Secondary | ICD-10-CM

## 2020-02-04 DIAGNOSIS — Z1159 Encounter for screening for other viral diseases: Secondary | ICD-10-CM

## 2020-02-04 DIAGNOSIS — E538 Deficiency of other specified B group vitamins: Secondary | ICD-10-CM | POA: Diagnosis not present

## 2020-02-04 DIAGNOSIS — E559 Vitamin D deficiency, unspecified: Secondary | ICD-10-CM

## 2020-02-04 DIAGNOSIS — F419 Anxiety disorder, unspecified: Secondary | ICD-10-CM

## 2020-02-04 DIAGNOSIS — R5383 Other fatigue: Secondary | ICD-10-CM | POA: Diagnosis not present

## 2020-02-04 DIAGNOSIS — E039 Hypothyroidism, unspecified: Secondary | ICD-10-CM

## 2020-02-04 DIAGNOSIS — R519 Headache, unspecified: Secondary | ICD-10-CM

## 2020-02-04 DIAGNOSIS — F329 Major depressive disorder, single episode, unspecified: Secondary | ICD-10-CM

## 2020-02-04 LAB — HEMOGLOBIN A1C: Hgb A1c MFr Bld: 5.1 % (ref 4.6–6.5)

## 2020-02-04 LAB — CBC
HCT: 39.8 % (ref 36.0–46.0)
Hemoglobin: 13.4 g/dL (ref 12.0–15.0)
MCHC: 33.8 g/dL (ref 30.0–36.0)
MCV: 89.9 fl (ref 78.0–100.0)
Platelets: 326 10*3/uL (ref 150.0–400.0)
RBC: 4.43 Mil/uL (ref 3.87–5.11)
RDW: 12.7 % (ref 11.5–15.5)
WBC: 11.1 10*3/uL — ABNORMAL HIGH (ref 4.0–10.5)

## 2020-02-04 LAB — COMPREHENSIVE METABOLIC PANEL
ALT: 12 U/L (ref 0–35)
AST: 13 U/L (ref 0–37)
Albumin: 4.3 g/dL (ref 3.5–5.2)
Alkaline Phosphatase: 94 U/L (ref 39–117)
BUN: 13 mg/dL (ref 6–23)
CO2: 26 mEq/L (ref 19–32)
Calcium: 9.7 mg/dL (ref 8.4–10.5)
Chloride: 104 mEq/L (ref 96–112)
Creatinine, Ser: 0.8 mg/dL (ref 0.40–1.20)
GFR: 85.28 mL/min (ref >60.00)
Glucose, Bld: 85 mg/dL (ref 70–99)
Potassium: 4.2 mEq/L (ref 3.5–5.1)
Sodium: 140 mEq/L (ref 135–145)
Total Bilirubin: 0.4 mg/dL (ref 0.2–1.2)
Total Protein: 7.5 g/dL (ref 6.0–8.3)

## 2020-02-04 LAB — TSH: TSH: 2.13 u[IU]/mL (ref 0.35–4.50)

## 2020-02-04 LAB — VITAMIN B12: Vitamin B-12: 795 pg/mL (ref 211–911)

## 2020-02-04 LAB — VITAMIN D 25 HYDROXY (VIT D DEFICIENCY, FRACTURES): VITD: 40.17 ng/mL (ref 30.00–100.00)

## 2020-02-04 NOTE — Patient Instructions (Addendum)
Stop by the lab prior to leaving today. I will notify you of your results once received.   Please update me regarding headaches as discussed.   It was a pleasure to see you today!   Influenza (Flu) Vaccine (Inactivated or Recombinant): What You Need to Know 1. Why get vaccinated? Influenza vaccine can prevent influenza (flu). Flu is a contagious disease that spreads around the [place] every year, usually between October and May. Anyone can get the flu, but it is more dangerous for some people. Infants and young children, people 48 years of age and older, pregnant women, and people with certain health conditions or a weakened immune system are at greatest risk of flu complications. Pneumonia, bronchitis, sinus infections and ear infections are examples of flu-related complications. If you have a medical condition, such as heart disease, cancer or diabetes, flu can make it worse. Flu can cause fever and chills, sore throat, muscle aches, fatigue, cough, headache, and runny or stuffy nose. Some people may have vomiting and diarrhea, though this is more common in children than adults. Each year thousands of people in the [place] die from flu, and many more are hospitalized. Flu vaccine prevents millions of illnesses and flu-related visits to the doctor each year. 2. Influenza vaccine CDC recommends everyone 70 months of age and older get vaccinated every flu season. Children 6 months through 62 years of age may need 2 doses during a single flu season. Everyone else needs only 1 dose each flu season. It takes about 2 weeks for protection to develop after vaccination. There are many flu viruses, and they are always changing. Each year a new flu vaccine is made to protect against three or four viruses that are likely to cause disease in the upcoming flu season. Even when the vaccine doesn't exactly match these viruses, it may still provide some protection. Influenza vaccine does not cause  flu. Influenza vaccine may be given at the same time as other vaccines. 3. Talk with your health care provider Tell your vaccine provider if the person getting the vaccine:  Has had an allergic reaction after a previous dose of influenza vaccine, or has any severe, life-threatening allergies.  Has ever had Guillain-Barr Syndrome (also called GBS). In some cases, your health care provider may decide to postpone influenza vaccination to a future visit. People with minor illnesses, such as a cold, may be vaccinated. People who are moderately or severely ill should usually wait until they recover before getting influenza vaccine. Your health care provider can give you more information. 4. Risks of a vaccine reaction  Soreness, redness, and swelling where shot is given, fever, muscle aches, and headache can happen after influenza vaccine.  There may be a very small increased risk of Guillain-Barr Syndrome (GBS) after inactivated influenza vaccine (the flu shot). Young children who get the flu shot along with pneumococcal vaccine (PCV13), and/or DTaP vaccine at the same time might be slightly more likely to have a seizure caused by fever. Tell your health care provider if a child who is getting flu vaccine has ever had a seizure. People sometimes faint after medical procedures, including vaccination. Tell your provider if you feel dizzy or have vision changes or ringing in the ears. As with any medicine, there is a very remote chance of a vaccine causing a severe allergic reaction, other serious injury, or death. 5. What if there is a serious problem? An allergic reaction could occur after the vaccinated person leaves the clinic.  If you see signs of a severe allergic reaction (hives, swelling of the face and throat, difficulty breathing, a fast heartbeat, dizziness, or weakness), call 9-1-1 and get the person to the nearest hospital. For other signs that concern you, call your health care  provider. Adverse reactions should be reported to the Vaccine Adverse Event Reporting System (VAERS). Your health care provider will usually file this report, or you can do it yourself. Visit the VAERS website at www.vaers.SamedayNews.es or call 626-484-7387.VAERS is only for reporting reactions, and VAERS staff do not give medical advice. 6. The National Vaccine Injury Compensation Program The [employer] Vaccine Injury Compensation Program (VICP) is a federal program that was created to compensate people who may have been injured by certain vaccines. Visit the VICP website at GoldCloset.com.ee or call (936)055-9018 to learn about the program and about filing a claim. There is a time limit to file a claim for compensation. 7. How can I learn more?  Ask your healthcare provider.  Call your local or state health department.  Contact the Centers for Disease Control and Prevention (CDC): ? Call 819-884-8603 (1-800-CDC-INFO) or ? Visit CDC's https://gibson.com/ Vaccine Information Statement (Interim) Inactivated Influenza Vaccine (12/19/2017) This information is not intended to replace advice given to you by your health care provider. Make sure you discuss any questions you have with your health care provider. Document Revised: 08/12/2018 Document Reviewed: 12/23/2017 Elsevier Patient Education  [place].

## 2020-02-04 NOTE — Assessment & Plan Note (Signed)
She is taking levothyroxine correctly. Repeat TSH pending. 

## 2020-02-04 NOTE — Progress Notes (Signed)
Subjective:    Patient ID: Consepcion Watson, female    DOB: Mar 16, 1991, 29 y.o.   MRN: [id number]  HPI  This visit occurred during the SARS-CoV-2 public health emergency.  Safety protocols were in place, including screening questions prior to the visit, additional usage of staff PPE, and extensive cleaning of exam room while observing appropriate contact time as indicated for disinfecting solutions.   Stephanie Watson is a 29 year old female with a history of hypothyroidism, obesity, anxiety and depression, vitamin B 12 deficiency, tachycardia, fatigue who presents today with a chief complaint of fatigue.   She is currently managed on levothyroxine 50 mcg for hypothyroidism. Last TSH of 1.02 in April 2021. She is taking her levothyroxine every morning on an empty stomach with water only. No food or medications for 1 hour, no vitamins or PPI for four hours or later.   Vitamin B12 deficiency that has been ongoing and historically slow to improve with oral B12 tablets, so we had her complete a series of B12 injections. She responded well to injections for which she discontinued in July 2021. She is now taking oral B12 1000 mcg tablets daily. She felt much better when she was on her B12 injections, fatigue has returned and has little to no energy.   She is taking 10,000 IU of vitamin D daily.  Currently managed on Lexapro 10 mg and Wellbutrin XL 150 mg for anxiety and depression. Overall she feels well managed on this regimen except for increased stress over the last several weeks due new job.   Today she endorses feeling very tired, has recently changed occupations and is picking up more hours at work. She loves her new occupation, but has been stressed.   Also with headaches, has one right now. Headaches are located to the bilateral temporal region and the parietal lobes. She describes the headaches like a "pressure". She will experience sensitivity to light and sound. Headaches began about one year  ago, more frequent now. Headaches are occurring every other day, migraines once weekly. She's been taking Sudafed PE, Aleve, Tylenol without improvement. She suspects the headaches are secondary to feeling tired.   She has been exercising often over the last three weeks. She endorses a healthy diet. Take out food once weekly, typically makes healthier choices. She denies eating junk food. Has had trouble losing weight despite this.   BP Readings from Last 3 Encounters:  02/04/20 110/62  08/25/19 100/60  03/13/19 115/64     Review of Systems  Constitutional: Positive for fatigue.  Eyes: Positive for photophobia.  Respiratory: Negative for shortness of breath.   Cardiovascular: Negative for chest pain and palpitations.  Neurological: Positive for headaches. Negative for dizziness.  Psychiatric/Behavioral:       Increased stress over the last 3 weeks       Past Medical History:  Diagnosis Date  . Obesity      Social History   Socioeconomic History  . Marital status: Married    Spouse name: Not on file  . Number of children: Not on file  . Years of education: Not on file  . Highest education level: Not on file  Occupational History  . Not on file  Tobacco Use  . Smoking status: Never Smoker  . Smokeless tobacco: Never Used  Substance and Sexual Activity  . Alcohol use: No  . Drug use: No  . Sexual activity: Yes    Birth control/protection: Other-see comments    Comment: 8 weeks  postpartum  Other Topics Concern  . Not on file  Social History Narrative   Married.   2 children.   Works as a Lawyer in [employer].   Enjoys spending time with her family.    Social Determinants of Health   Financial Resource Strain:   . Difficulty of Paying Living Expenses: Not on file  Food Insecurity:   . Worried About Programme researcher, broadcasting/film/video in the Last Year: Not on file  . Ran Out of Food in the Last Year: Not on file  Transportation Needs:   . Lack of Transportation (Medical): Not on  file  . Lack of Transportation (Non-Medical): Not on file  Physical Activity:   . Days of Exercise per Week: Not on file  . Minutes of Exercise per Session: Not on file  Stress:   . Feeling of Stress : Not on file  Social Connections:   . Frequency of Communication with Friends and Family: Not on file  . Frequency of Social Gatherings with Friends and Family: Not on file  . Attends Religious Services: Not on file  . Active Member of Clubs or Organizations: Not on file  . Attends Banker Meetings: Not on file  . Marital Status: Not on file  Intimate Partner Violence:   . Fear of Current or Ex-Partner: Not on file  . Emotionally Abused: Not on file  . Physically Abused: Not on file  . Sexually Abused: Not on file    Past Surgical History:  Procedure Laterality Date  . CESAREAN SECTION    . CHOLECYSTECTOMY N/A 06/22/2012   Procedure: LAPAROSCOPIC CHOLECYSTECTOMY WITH INTRAOPERATIVE CHOLANGIOGRAM;  Surgeon: Mariella Saa, MD;  Location: [facility];  Service: General;  Laterality: N/A;  . ERCP N/A 07/02/2012   Procedure: ENDOSCOPIC RETROGRADE CHOLANGIOPANCREATOGRAPHY (ERCP);  Surgeon: Louis Meckel, MD;  Location: [facility];  Service: Endoscopy;  Laterality: N/A;  . ERCP N/A 07/02/2012   Procedure: ENDOSCOPIC RETROGRADE CHOLANGIOPANCREATOGRAPHY (ERCP);  Surgeon: Louis Meckel, MD;  Location: [facility];  Service: Gastroenterology;  Laterality: N/A;    Family History  Problem Relation Age of Onset  . Diabetes Mellitus II Mother   . Hyperlipidemia Father   . Melanoma Maternal Grandmother   . Heart attack Maternal Grandmother   . Heart attack Paternal Grandfather     Allergies  Allergen Reactions  . Latex Swelling  . Penicillins Swelling    Current Outpatient Medications on File Prior to Visit  Medication Sig Dispense Refill  . buPROPion (WELLBUTRIN XL) 150 MG 24 hr tablet TAKE 1 TABLET DAILY FOR ANXIETY AND DEPRESSION 90 tablet 1  . desogestrel-ethinyl  estradiol (VIORELE) 0.15-0.02/0.01 MG (21/5) tablet Take 1 tablet by mouth daily. 3 Package 3  . escitalopram (LEXAPRO) 10 MG tablet TAKE 1 TABLET DAILY FOR ANXIETY AND DEPRESSION 90 tablet 1  . levothyroxine (SYNTHROID) 50 MCG tablet Take 1 tablet by mouth every morning on an empty stomach with water only.  No food or other medications for 30 minutes. 90 tablet 3   No current facility-administered medications on file prior to visit.    BP 110/62   Pulse 91   Temp 98.5 F (36.9 C) (Temporal)   Ht 5' (1.524 m)   Wt 223 lb (101.2 kg)   SpO2 97%   BMI 43.55 kg/m    Objective:   Physical Exam Cardiovascular:     Rate and Rhythm: Normal rate and regular rhythm.  Pulmonary:     Effort: Pulmonary effort is  normal.     Breath sounds: Normal breath sounds.  Musculoskeletal:     Cervical back: Neck supple.  Skin:    General: Skin is warm and dry.  Psychiatric:        Mood and Affect: Mood normal.            Assessment & Plan:

## 2020-02-04 NOTE — Assessment & Plan Note (Signed)
Chronic for one year, now more frequent. Discussed the option of headache prevention treatment, but will first await lab results.  Consider propranolol ER 80.

## 2020-02-04 NOTE — Assessment & Plan Note (Signed)
Significantly improved with B12 injections, now fatigue has returned since she's been without. Only on oral B12.  Checking labs today including TSH, CBC, vitamin levels, CMP, A1C.  Consider returning to B12 injections, may need monthly from now moving forward. Await results.

## 2020-02-04 NOTE — Assessment & Plan Note (Signed)
On weekly B12 injections for a period of time, now only on oral B12 1000 mcg.  If B12 level low, then recommend B12 injections twice monthly x 1 month, then monthly thereafter. Await results.

## 2020-02-04 NOTE — Assessment & Plan Note (Signed)
Overall well managed on current regimen of Lexapro and Wellbutrin, continue same for now.

## 2020-02-04 NOTE — Assessment & Plan Note (Signed)
Compliant to vitamin D 10,000 units daily. Repeat vitamin D level pending.

## 2020-02-05 LAB — HEPATITIS C ANTIBODY
Hepatitis C Ab: NONREACTIVE
SIGNAL TO CUT-OFF: 0.02 (ref <1.00)

## 2020-02-15 ENCOUNTER — Other Ambulatory Visit: Payer: Self-pay | Admitting: Primary Care

## 2020-02-15 DIAGNOSIS — F419 Anxiety disorder, unspecified: Secondary | ICD-10-CM

## 2020-02-15 DIAGNOSIS — F32A Depression, unspecified: Secondary | ICD-10-CM

## 2020-03-02 ENCOUNTER — Other Ambulatory Visit: Payer: Self-pay | Admitting: Primary Care

## 2020-03-02 DIAGNOSIS — E039 Hypothyroidism, unspecified: Secondary | ICD-10-CM

## 2020-03-14 ENCOUNTER — Other Ambulatory Visit: Payer: Self-pay | Admitting: Primary Care

## 2020-03-14 DIAGNOSIS — F32A Depression, unspecified: Secondary | ICD-10-CM

## 2020-03-14 DIAGNOSIS — F419 Anxiety disorder, unspecified: Secondary | ICD-10-CM

## 2020-06-16 ENCOUNTER — Other Ambulatory Visit: Payer: Self-pay | Admitting: Primary Care

## 2020-06-16 DIAGNOSIS — Z3041 Encounter for surveillance of contraceptive pills: Secondary | ICD-10-CM

## 2020-06-16 NOTE — Telephone Encounter (Signed)
Look like she may be due for pap smear, will you ask? Does she see GYN? If not we can do it but needs to be done soon. I will order a 3 month supply of her requested OCP's for now.

## 2020-07-04 NOTE — Telephone Encounter (Signed)
App made she will need pap here

## 2020-07-12 ENCOUNTER — Emergency Department
Admission: EM | Admit: 2020-07-12 | Discharge: 2020-07-12 | Disposition: A | Discharge status: Home / Self Care | Payer: 59 | Attending: Emergency Medicine | Admitting: Emergency Medicine

## 2020-07-12 ENCOUNTER — Encounter: Payer: Self-pay | Admitting: Emergency Medicine

## 2020-07-12 ENCOUNTER — Other Ambulatory Visit: Payer: Self-pay

## 2020-07-12 DIAGNOSIS — Z91040 Latex allergy status: Secondary | ICD-10-CM | POA: Insufficient documentation

## 2020-07-12 DIAGNOSIS — Z79899 Other long term (current) drug therapy: Secondary | ICD-10-CM | POA: Insufficient documentation

## 2020-07-12 DIAGNOSIS — R070 Pain in throat: Secondary | ICD-10-CM | POA: Diagnosis present

## 2020-07-12 DIAGNOSIS — K122 Cellulitis and abscess of mouth: Secondary | ICD-10-CM | POA: Diagnosis not present

## 2020-07-12 DIAGNOSIS — E039 Hypothyroidism, unspecified: Secondary | ICD-10-CM | POA: Insufficient documentation

## 2020-07-12 MED ORDER — LIDOCAINE VISCOUS HCL 2 % MT SOLN: 5.0000 mL | Freq: Four times a day (QID) | OROMUCOSAL | 0 refills | Status: DC | PRN

## 2020-07-12 MED ORDER — CLINDAMYCIN PHOSPHATE 600 MG/50ML IV SOLN
600.0000 mg | Freq: Once | INTRAVENOUS | Status: AC
  Administered 2020-07-12: 600 mg via INTRAVENOUS
  Filled 2020-07-12: qty 50

## 2020-07-12 MED ORDER — METHYLPREDNISOLONE 4 MG PO TBPK: ORAL_TABLET | ORAL | 0 refills | Status: DC

## 2020-07-12 MED ORDER — METHYLPREDNISOLONE SODIUM SUCC 125 MG IJ SOLR
80.0000 mg | Freq: Once | INTRAMUSCULAR | Status: AC
  Administered 2020-07-12: 80 mg via INTRAVENOUS
  Filled 2020-07-12: qty 2

## 2020-07-12 MED ORDER — SODIUM CHLORIDE 0.9 % IV BOLUS
1000.0000 mL | Freq: Once | INTRAVENOUS | Status: AC
  Administered 2020-07-12: 1000 mL via INTRAVENOUS

## 2020-07-12 MED ORDER — CLARITHROMYCIN 500 MG PO TABS: 500.0000 mg | ORAL_TABLET | Freq: Two times a day (BID) | ORAL | 0 refills | Status: AC

## 2020-07-12 NOTE — Discharge Instructions (Signed)
Follow discharge care instruction take medication as directed. °

## 2020-07-12 NOTE — ED Provider Notes (Signed)
[facility] Emergency Department Provider Note   ____________________________________________   Event Date/Time   First MD Initiated Contact with Patient 07/12/20 1339     (approximate)  I have reviewed the triage vital signs and the nursing notes.   HISTORY  Chief Complaint Sore Throat and Uvula swelling    HPI Stephanie Watson is a 30 y.o. female patient sent from the [place] clinic secondary to over the edema.  Positive concern for oral abscess.  Patient awakened this morning with pain and swelling to her throat.  She was seen at the clinic and was found to be negative for strep pharyngitis and mononucleosis.  Patient has elevated white blood cell count.  Patient also has a right upper wisdom tooth pain.  Rates the pain as a 6/10.  Scribed pain is "achy".  Able to tolerate soft food and fluids.  Has not tried solid foods.         Past Medical History:  Diagnosis Date  . Obesity     Patient Active Problem List   Diagnosis Date Noted  . Frequent headaches 02/04/2020  . Elbow pain, right 08/25/2019  . Fatigue 06/05/2019  . Tachycardia 07/11/2018  . Hypotension 07/11/2018  . Hypothyroidism 05/12/2018  . Vitamin D deficiency 05/12/2018  . Vitamin B 12 deficiency 05/12/2018  . Encounter for birth control pills maintenance 03/31/2018  . Preventative health care 03/31/2018  . Anxiety and depression 03/31/2018  . Obesity (BMI 35.0-39.9 without comorbidity) 10/10/2015  . Irregular menstrual cycle 10/10/2015    Past Surgical History:  Procedure Laterality Date  . CESAREAN SECTION    . CHOLECYSTECTOMY N/A 06/22/2012   Procedure: LAPAROSCOPIC CHOLECYSTECTOMY WITH INTRAOPERATIVE CHOLANGIOGRAM;  Surgeon: Mariella Saa, MD;  Location: [facility];  Service: General;  Laterality: N/A;  . ERCP N/A 07/02/2012   Procedure: ENDOSCOPIC RETROGRADE CHOLANGIOPANCREATOGRAPHY (ERCP);  Surgeon: Louis Meckel, MD;  Location: [facility];  Service: Endoscopy;   Laterality: N/A;  . ERCP N/A 07/02/2012   Procedure: ENDOSCOPIC RETROGRADE CHOLANGIOPANCREATOGRAPHY (ERCP);  Surgeon: Louis Meckel, MD;  Location: [facility];  Service: Gastroenterology;  Laterality: N/A;    Prior to Admission medications   Medication Sig Start Date End Date Taking? Authorizing Provider  clarithromycin (BIAXIN) 500 MG tablet Take 1 tablet (500 mg total) by mouth 2 (two) times daily for 14 days. 07/12/20 07/26/20 Yes Joni Reining, PA-C  lidocaine (XYLOCAINE) 2 % solution Use as directed 5 mLs in the mouth or throat every 6 (six) hours as needed for mouth pain. For oral swish/gargle. 07/12/20  Yes Joni Reining, PA-C  methylPREDNISolone (MEDROL DOSEPAK) 4 MG TBPK tablet Take Tapered dose as directed 07/12/20  Yes Joni Reining, PA-C  buPROPion (WELLBUTRIN XL) 150 MG 24 hr tablet TAKE 1 TABLET DAILY FOR ANXIETY AND DEPRESSION 02/16/20   Doreene Nest, NP  escitalopram (LEXAPRO) 10 MG tablet TAKE 1 TABLET DAILY FOR ANXIETY AND DEPRESSION 03/14/20   Doreene Nest, NP  levothyroxine (SYNTHROID) 50 MCG tablet TAKE 1 TABLET EVERY MORNING ON AN EMPTY STOMACH WITH WATER ONLY. NO FOOD OR OTHER MEDICATIONS FOR 30 MINUTES. 03/03/20   Doreene Nest, NP  VOLNEA 0.15-0.02/0.01 MG (21/5) tablet TAKE 1 TABLET DAILY 06/16/20   Doreene Nest, NP    Allergies Latex and Penicillins  Family History  Problem Relation Age of Onset  . Diabetes Mellitus II Mother   . Hyperlipidemia Father   . Melanoma Maternal Grandmother   . Heart attack Maternal Grandmother   .  Heart attack Paternal Grandfather     Social History Social History   Tobacco Use  . Smoking status: Never Smoker  . Smokeless tobacco: Never Used  Substance Use Topics  . Alcohol use: No  . Drug use: No    Review of Systems Constitutional: No fever/chills Eyes: No visual changes. ENT: No sore throat.  Swollen uvula Cardiovascular: Denies chest pain. Respiratory: Denies shortness of  breath. Gastrointestinal: No abdominal pain.  No nausea, no vomiting.  No diarrhea.  No constipation. Genitourinary: Negative for dysuria. Musculoskeletal: Negative for back pain. Skin: Negative for rash. Neurological: Negative for headaches, focal weakness or numbness. Allergic/Immunilogical: Latex and penicillin. ____________________________________________   PHYSICAL EXAM:  VITAL SIGNS: ED Triage Vitals  Enc Vitals Group     BP 07/12/20 1311 124/76     Pulse Rate 07/12/20 1311 (!) 113     Resp 07/12/20 1311 18     Temp 07/12/20 1311 98.3 F (36.8 C)     Temp Source 07/12/20 1311 Oral     SpO2 07/12/20 1311 99 %     Weight 07/12/20 1316 223 lb 1.7 oz (101.2 kg)     Height 07/12/20 1340 5' (1.524 m)     Head Circumference --      Peak Flow --      Pain Score --      Pain Loc --      Pain Edu? --      Excl. in GC? --     Constitutional: Alert and oriented. Well appearing and in no acute distress. Eyes: Conjunctivae are normal. PERRL. EOMI. Head: Atraumatic. Nose: No congestion/rhinnorhea. Mouth/Throat: Mucous membranes are moist.  Oropharynx non-erythematous.  Edematous erythematous uvula. Neck: No stridor.  Hematological/Lymphatic/Immunilogical: No cervical lymphadenopathy. Cardiovascular: Normal rate, regular rhythm. Grossly normal heart sounds.  Good peripheral circulation. Respiratory: Normal respiratory effort.  No retractions. Lungs CTAB. Skin:  Skin is warm, dry and intact. No rash noted. Psychiatric: Mood and affect are normal. Speech and behavior are normal.  ____________________________________________   LABS (all labs ordered are listed, but only abnormal results are displayed)  Labs Reviewed - No data to display ____________________________________________  EKG   ____________________________________________  RADIOLOGY I, Joni Reining, personally viewed and evaluated these images (plain radiographs) as part of my medical decision making, as well  as reviewing the written report by the radiologist.  ED MD interpretation:    Official radiology report(s): No results found.  ____________________________________________   PROCEDURES  Procedure(s) performed (including Critical Care):  Procedures   ____________________________________________   INITIAL IMPRESSION / ASSESSMENT AND PLAN / ED COURSE  As part of my medical decision making, I reviewed the following data within the electronic MEDICAL RECORD NUMBER         Patient presents with swollen uvula from the urgent care clinic.  Reviewed patient labs and discussed rationale for starting IV antibiotics.  Patient given discharge care instructions and advised to switch to oral antibiotics as directed.  Follow with the ENT clinic condition worsens.      ____________________________________________   FINAL CLINICAL IMPRESSION(S) / ED DIAGNOSES  Final diagnoses:  Uvulitis     ED Discharge Orders         Ordered    clarithromycin (BIAXIN) 500 MG tablet  2 times daily        07/12/20 1519    methylPREDNISolone (MEDROL DOSEPAK) 4 MG TBPK tablet        07/12/20 1519    lidocaine (XYLOCAINE) 2 % solution  Every 6 hours PRN        07/12/20 1519          *Please note:  Stephanie Watson was evaluated in Emergency Department on 07/12/2020 for the symptoms described in the history of present illness. She was evaluated in the context of the global COVID-19 pandemic, which necessitated consideration that the patient might be at risk for infection with the SARS-CoV-2 virus that causes COVID-19. Institutional protocols and algorithms that pertain to the evaluation of patients at risk for COVID-19 are in a state of rapid change based on information released by regulatory bodies including the CDC and federal and state organizations. These policies and algorithms were followed during the patient's care in the ED.  Some ED evaluations and interventions may be delayed as a result of limited  staffing during and the pandemic.*   Note:  This document was prepared using Dragon voice recognition software and may include unintentional dictation errors.    Joni Reining, PA-C 07/12/20 1521    Sharyn Creamer, MD 07/12/20 [phone]

## 2020-07-12 NOTE — ED Triage Notes (Signed)
Pt sent by [facility] for uvula swelling - concern for possible uvular abcess. Pt states she woke up with pain and swelling to throat, feels she's having trouble swallowing. Denies any recent sickness, afebrile. Does c/o R upper mouth pain d/t wisdom teeth. afebrile

## 2020-07-27 ENCOUNTER — Encounter: Payer: Self-pay | Admitting: Primary Care

## 2020-07-27 ENCOUNTER — Ambulatory Visit (INDEPENDENT_AMBULATORY_CARE_PROVIDER_SITE_OTHER): Payer: 59 | Admitting: Primary Care

## 2020-07-27 ENCOUNTER — Other Ambulatory Visit (HOSPITAL_COMMUNITY)
Admission: RE | Admit: 2020-07-27 | Discharge: 2020-07-27 | Disposition: A | Discharge status: Home / Self Care | Payer: 59 | Source: Ambulatory Visit | Attending: Primary Care | Admitting: Primary Care

## 2020-07-27 ENCOUNTER — Other Ambulatory Visit: Payer: Self-pay

## 2020-07-27 VITALS — BP 120/62 | HR 103 | Temp 98.3°F | Ht 60.0 in | Wt 214.0 lb

## 2020-07-27 DIAGNOSIS — Z0001 Encounter for general adult medical examination with abnormal findings: Secondary | ICD-10-CM

## 2020-07-27 DIAGNOSIS — E039 Hypothyroidism, unspecified: Secondary | ICD-10-CM

## 2020-07-27 DIAGNOSIS — E538 Deficiency of other specified B group vitamins: Secondary | ICD-10-CM | POA: Diagnosis not present

## 2020-07-27 DIAGNOSIS — F32A Depression, unspecified: Secondary | ICD-10-CM

## 2020-07-27 DIAGNOSIS — R5383 Other fatigue: Secondary | ICD-10-CM

## 2020-07-27 DIAGNOSIS — Z124 Encounter for screening for malignant neoplasm of cervix: Secondary | ICD-10-CM | POA: Diagnosis present

## 2020-07-27 DIAGNOSIS — E559 Vitamin D deficiency, unspecified: Secondary | ICD-10-CM

## 2020-07-27 DIAGNOSIS — D72829 Elevated white blood cell count, unspecified: Secondary | ICD-10-CM

## 2020-07-27 DIAGNOSIS — Z3041 Encounter for surveillance of contraceptive pills: Secondary | ICD-10-CM

## 2020-07-27 DIAGNOSIS — N926 Irregular menstruation, unspecified: Secondary | ICD-10-CM

## 2020-07-27 DIAGNOSIS — F419 Anxiety disorder, unspecified: Secondary | ICD-10-CM

## 2020-07-27 LAB — COMPREHENSIVE METABOLIC PANEL
ALT: 19 U/L (ref 0–35)
AST: 14 U/L (ref 0–37)
Albumin: 4.4 g/dL (ref 3.5–5.2)
Alkaline Phosphatase: 100 U/L (ref 39–117)
BUN: 14 mg/dL (ref 6–23)
CO2: 27 mEq/L (ref 19–32)
Calcium: 9.5 mg/dL (ref 8.4–10.5)
Chloride: 100 mEq/L (ref 96–112)
Creatinine, Ser: 0.79 mg/dL (ref 0.40–1.20)
GFR: 101.6 mL/min (ref >60.00)
Glucose, Bld: 97 mg/dL (ref 70–99)
Potassium: 3.9 mEq/L (ref 3.5–5.1)
Sodium: 139 mEq/L (ref 135–145)
Total Bilirubin: 0.5 mg/dL (ref 0.2–1.2)
Total Protein: 7.3 g/dL (ref 6.0–8.3)

## 2020-07-27 LAB — LIPID PANEL
Cholesterol: 213 mg/dL — ABNORMAL HIGH (ref 0–200)
HDL: 45.3 mg/dL (ref >39.00)
LDL Cholesterol: 139 mg/dL — ABNORMAL HIGH (ref 0–99)
NonHDL: 167.9
Total CHOL/HDL Ratio: 5
Triglycerides: 145 mg/dL (ref 0.0–149.0)
VLDL: 29 mg/dL (ref 0.0–40.0)

## 2020-07-27 LAB — CBC
HCT: 40.1 % (ref 36.0–46.0)
Hemoglobin: 13.7 g/dL (ref 12.0–15.0)
MCHC: 34.1 g/dL (ref 30.0–36.0)
MCV: 90.3 fl (ref 78.0–100.0)
Platelets: 247 10*3/uL (ref 150.0–400.0)
RBC: 4.44 Mil/uL (ref 3.87–5.11)
RDW: 12.7 % (ref 11.5–15.5)
WBC: 11.4 10*3/uL — ABNORMAL HIGH (ref 4.0–10.5)

## 2020-07-27 LAB — VITAMIN D 25 HYDROXY (VIT D DEFICIENCY, FRACTURES): VITD: 40.23 ng/mL (ref 30.00–100.00)

## 2020-07-27 LAB — VITAMIN B12: Vitamin B-12: 584 pg/mL (ref 211–911)

## 2020-07-27 LAB — TSH: TSH: 1.1 u[IU]/mL (ref 0.35–4.50)

## 2020-07-27 MED ORDER — ESCITALOPRAM OXALATE 20 MG PO TABS: 20.0000 mg | ORAL_TABLET | Freq: Every day | ORAL | 3 refills | Status: DC

## 2020-07-27 NOTE — Assessment & Plan Note (Signed)
Deteriorated.  Question whether this is secondary to her fatigue and tiredness, personal stressors.  Discussed options which included switching from Lexapro to Effexor XR 37.5 mg, weaning off Wellbutrin XL 150 mg OR continuing Lexapro 20 mg and checking B12 with potentially replacing oral tablets with IM injections.  She opts for the latter. New Rx sent for Lexapro 20 mg. Checking B12 today. Continue Wellbutrin XL 150 mg for now, consider weaning off in the future.  She opts to

## 2020-07-27 NOTE — Assessment & Plan Note (Signed)
Pap smear due today, completed. Continue OCP's.

## 2020-07-27 NOTE — Patient Instructions (Signed)
Stop by the lab prior to leaving today. I will notify you of your results once received.   Continue exercising. You should be getting 150 minutes of moderate intensity exercise weekly.  We increased the dose of your Lexapro to 20 mg, I sent a new prescription to the mail order pharmacy.  It was a pleasure to see you today!   Preventive Care 75-29 Years Old, Female Preventive care refers to lifestyle choices and visits with your health care provider that can promote health and wellness. This includes:  A yearly physical exam. This is also called an annual wellness visit.  Regular dental and eye exams.  Immunizations.  Screening for certain conditions.  Healthy lifestyle choices, such as: ? Eating a healthy diet. ? Getting regular exercise. ? Not using drugs or products that contain nicotine and tobacco. ? Limiting alcohol use. What can I expect for my preventive care visit? Physical exam Your health care provider may check your:  Height and weight. These may be used to calculate your BMI (body mass index). BMI is a measurement that tells if you are at a healthy weight.  Heart rate and blood pressure.  Body temperature.  Skin for abnormal spots. Counseling Your health care provider may ask you questions about your:  Past medical problems.  Family's medical history.  Alcohol, tobacco, and drug use.  Emotional well-being.  Home life and relationship well-being.  Sexual activity.  Diet, exercise, and sleep habits.  Work and work Statistician.  Access to firearms.  Method of birth control.  Menstrual cycle.  Pregnancy history. What immunizations do I need? Vaccines are usually given at various ages, according to a schedule. Your health care provider will recommend vaccines for you based on your age, medical history, and lifestyle or other factors, such as travel or where you work.   What tests do I need? Blood tests  Lipid and cholesterol levels. These may  be checked every 5 years starting at age 19.  Hepatitis C test.  Hepatitis B test. Screening  Diabetes screening. This is done by checking your blood sugar (glucose) after you have not eaten for a while (fasting).  STD (sexually transmitted disease) testing, if you are at risk.  BRCA-related cancer screening. This may be done if you have a family history of breast, ovarian, tubal, or peritoneal cancers.  Pelvic exam and Pap test. This may be done every 3 years starting at age 21. Starting at age 74, this may be done every 5 years if you have a Pap test in combination with an HPV test. Talk with your health care provider about your test results, treatment options, and if necessary, the need for more tests.   Follow these instructions at home: Eating and drinking  Eat a healthy diet that includes fresh fruits and vegetables, whole grains, lean protein, and low-fat dairy products.  Take vitamin and mineral supplements as recommended by your health care provider.  Do not drink alcohol if: ? Your health care provider tells you not to drink. ? You are pregnant, may be pregnant, or are planning to become pregnant.  If you drink alcohol: ? Limit how much you have to 0-1 drink a day. ? Be aware of how much alcohol is in your drink. In the [place] one drink equals one 12 oz bottle of beer (355 mL), one 5 oz glass of wine (148 mL), or one 1 oz glass of hard liquor (44 mL).   Lifestyle  Take daily care of your  teeth and gums. Brush your teeth every morning and night with fluoride toothpaste. Floss one time each day.  Stay active. Exercise for at least 30 minutes 5 or more days each week.  Do not use any products that contain nicotine or tobacco, such as cigarettes, e-cigarettes, and chewing tobacco. If you need help quitting, ask your health care provider.  Do not use drugs.  If you are sexually active, practice safe sex. Use a condom or other form of protection to prevent STIs (sexually  transmitted infections).  If you do not wish to become pregnant, use a form of birth control. If you plan to become pregnant, see your health care provider for a prepregnancy visit.  Find healthy ways to cope with stress, such as: ? Meditation, yoga, or listening to music. ? Journaling. ? Talking to a trusted person. ? Spending time with friends and family. Safety  Always wear your seat belt while driving or riding in a vehicle.  Do not drive: ? If you have been drinking alcohol. Do not ride with someone who has been drinking. ? When you are tired or distracted. ? While texting.  Wear a helmet and other protective equipment during sports activities.  If you have firearms in your house, make sure you follow all gun safety procedures.  Seek help if you have been physically or sexually abused. What's next?  Go to your health care provider once a year for an annual wellness visit.  Ask your health care provider how often you should have your eyes and teeth checked.  Stay up to date on all vaccines. This information is not intended to replace advice given to you by your health care provider. Make sure you discuss any questions you have with your health care provider. Document Revised: 12/20/2019 Document Reviewed: 01/02/2018 Elsevier Patient Education  2021 [employer].

## 2020-07-27 NOTE — Assessment & Plan Note (Addendum)
No longer on B12 injections, has been on 2000 mcg daily for the last year.   Repeat B12 level pending today. Felt better on injections, will switch over to inections if warranted.

## 2020-07-27 NOTE — Assessment & Plan Note (Addendum)
She is taking levothyroxine 50 mcg correctly.  Continue levothyroxine 50 mcg. Repeat TSH pending.

## 2020-07-27 NOTE — Assessment & Plan Note (Signed)
Continued, now on OCP's and hasn't noticed improvement.   Patient prefers to continue OCP's. Continue same.

## 2020-07-27 NOTE — Progress Notes (Signed)
Subjective:    Patient ID: Stephanie Watson, female    DOB: 05-07-1991, 30 y.o.   MRN: [id number]  HPI  Stephanie Watson is a very pleasant 30 y.o. female who presents today for complete physical.  She is feeling very fatigued and tired, stressed, irritable, "snapping" at friends and family. One month ago she increased her Lexapro to 20 mg on her own for these symptoms, doesn't feel that this has been effective. Also on bupropion XL 150 mg, doesn't feel that this medication is or was effective.   She's tried Zoloft in the past which was ineffective. The Lexapro was effective initially. She endorses a lot of added stress in her personal life. She actually felt better when taking B12 injections. She's wondering if resuming b12 injections will help her to feel better.   Immunizations: -Tetanus: Unsure, declines today.  -Influenza: Completed this season  -Covid-19: Completed two doses  Diet: She endorses an improved diet.  Exercise: She recently resumed exercise.   Eye exam: Completes annually  Dental exam: Completes semi-annually   Pap Smear: Due today  BP Readings from Last 3 Encounters:  07/27/20 120/62  07/12/20 120/70  02/04/20 110/62        Review of Systems  Constitutional: Positive for fatigue. Negative for unexpected weight change.  HENT: Negative for rhinorrhea.   Eyes: Negative for visual disturbance.  Respiratory: Negative for cough and shortness of breath.   Cardiovascular: Negative for chest pain.  Gastrointestinal: Negative for constipation and diarrhea.  Genitourinary: Negative for difficulty urinating.  Musculoskeletal: Negative for arthralgias.  Skin: Negative for rash.  Allergic/Immunologic: Negative for environmental allergies.  Neurological: Negative for dizziness and headaches.  Psychiatric/Behavioral: The patient is nervous/anxious.          Past Medical History:  Diagnosis Date  . Obesity     Social History   Socioeconomic History  .  Marital status: Married    Spouse name: Not on file  . Number of children: Not on file  . Years of education: Not on file  . Highest education level: Not on file  Occupational History  . Not on file  Tobacco Use  . Smoking status: Never Smoker  . Smokeless tobacco: Never Used  Substance and Sexual Activity  . Alcohol use: No  . Drug use: No  . Sexual activity: Yes    Birth control/protection: Other-see comments    Comment: 8 weeks postpartum  Other Topics Concern  . Not on file  Social History Narrative   Married.   2 children.   Works as a Lawyer in [employer].   Enjoys spending time with her family.    Social Determinants of Health   Financial Resource Strain: Not on file  Food Insecurity: Not on file  Transportation Needs: Not on file  Physical Activity: Not on file  Stress: Not on file  Social Connections: Not on file  Intimate Partner Violence: Not on file    Past Surgical History:  Procedure Laterality Date  . CESAREAN SECTION    . CHOLECYSTECTOMY N/A 06/22/2012   Procedure: LAPAROSCOPIC CHOLECYSTECTOMY WITH INTRAOPERATIVE CHOLANGIOGRAM;  Surgeon: Mariella Saa, MD;  Location: [facility];  Service: General;  Laterality: N/A;  . ERCP N/A 07/02/2012   Procedure: ENDOSCOPIC RETROGRADE CHOLANGIOPANCREATOGRAPHY (ERCP);  Surgeon: Louis Meckel, MD;  Location: [facility];  Service: Endoscopy;  Laterality: N/A;  . ERCP N/A 07/02/2012   Procedure: ENDOSCOPIC RETROGRADE CHOLANGIOPANCREATOGRAPHY (ERCP);  Surgeon: Louis Meckel, MD;  Location: [facility];  Service: Gastroenterology;  Laterality: N/A;    Family History  Problem Relation Age of Onset  . Diabetes Mellitus II Mother   . Hyperlipidemia Father   . Melanoma Maternal Grandmother   . Heart attack Maternal Grandmother   . Heart attack Paternal Grandfather     Allergies  Allergen Reactions  . Latex Swelling  . Penicillins Swelling    Current Outpatient Medications on File Prior to Visit  Medication Sig  Dispense Refill  . buPROPion (WELLBUTRIN XL) 150 MG 24 hr tablet TAKE 1 TABLET DAILY FOR ANXIETY AND DEPRESSION 90 tablet 3  . escitalopram (LEXAPRO) 10 MG tablet TAKE 1 TABLET DAILY FOR ANXIETY AND DEPRESSION (Patient taking differently: 20 mg daily) 90 tablet 3  . levothyroxine (SYNTHROID) 50 MCG tablet TAKE 1 TABLET EVERY MORNING ON AN EMPTY STOMACH WITH WATER ONLY. NO FOOD OR OTHER MEDICATIONS FOR 30 MINUTES. 90 tablet 3  . VOLNEA 0.15-0.02/0.01 MG (21/5) tablet TAKE 1 TABLET DAILY 84 tablet 0   No current facility-administered medications on file prior to visit.    BP 120/62   Pulse (!) 103   Temp 98.3 F (36.8 C) (Temporal)   Ht 5' (1.524 m)   Wt 214 lb (97.1 kg)   LMP 07/24/2020   SpO2 96%   BMI 41.79 kg/m  Objective:   Physical Exam HENT:     Right Ear: Tympanic membrane and ear canal normal.     Left Ear: Tympanic membrane and ear canal normal.     Nose: Nose normal.  Eyes:     Conjunctiva/sclera: Conjunctivae normal.     Pupils: Pupils are equal, round, and reactive to light.  Neck:     Thyroid: No thyromegaly.  Cardiovascular:     Rate and Rhythm: Normal rate and regular rhythm.     Heart sounds: No murmur heard.   Pulmonary:     Effort: Pulmonary effort is normal.     Breath sounds: Normal breath sounds. No rales.  Abdominal:     General: Bowel sounds are normal.     Palpations: Abdomen is soft.     Tenderness: There is no abdominal tenderness.  Genitourinary:    Labia:        Right: No tenderness or lesion.        Left: No tenderness or lesion.      Vagina: No vaginal discharge.     Cervix: Cervical bleeding present. No cervical motion tenderness or erythema.     Uterus: Normal.      Adnexa: Right adnexa normal and left adnexa normal.  Musculoskeletal:        General: Normal range of motion.     Cervical back: Neck supple.  Lymphadenopathy:     Cervical: No cervical adenopathy.  Skin:    General: Skin is warm and dry.     Findings: No rash.   Neurological:     Mental Status: She is alert and oriented to person, place, and time.     Cranial Nerves: No cranial nerve deficit.     Deep Tendon Reflexes: Reflexes are normal and symmetric.  Psychiatric:        Mood and Affect: Mood normal.           Assessment & Plan:      This visit occurred during the SARS-CoV-2 public health emergency.  Safety protocols were in place, including screening questions prior to the visit, additional usage of staff PPE, and extensive cleaning of exam room while observing appropriate contact  time as indicated for disinfecting solutions.

## 2020-07-27 NOTE — Assessment & Plan Note (Signed)
Chronic, occurring since off of B12 inections.  Repeat B12 level pending. Repeat TSH pending.  Will replace oral B12 with Injections if warranted.  Commended her on regular exercise, will also work to improve anxiety.

## 2020-07-27 NOTE — Assessment & Plan Note (Signed)
Compliant to vitamin D3 5000 IU daily, repeat level pending.

## 2020-07-27 NOTE — Assessment & Plan Note (Signed)
Immunizations UTD except for tetanus for which she declines.  Pap smear due today, completed.  Discussed the importance of a healthy diet and regular exercise in order for weight loss, and to reduce the risk of any potential medical problems.  Exam today as noted. Labs pending.

## 2020-07-28 ENCOUNTER — Other Ambulatory Visit (INDEPENDENT_AMBULATORY_CARE_PROVIDER_SITE_OTHER): Payer: 59

## 2020-07-28 DIAGNOSIS — E538 Deficiency of other specified B group vitamins: Secondary | ICD-10-CM

## 2020-07-28 DIAGNOSIS — D72820 Lymphocytosis (symptomatic): Secondary | ICD-10-CM

## 2020-07-28 DIAGNOSIS — Z3041 Encounter for surveillance of contraceptive pills: Secondary | ICD-10-CM

## 2020-07-28 DIAGNOSIS — F32A Depression, unspecified: Secondary | ICD-10-CM

## 2020-07-28 DIAGNOSIS — F419 Anxiety disorder, unspecified: Secondary | ICD-10-CM

## 2020-07-28 DIAGNOSIS — E039 Hypothyroidism, unspecified: Secondary | ICD-10-CM

## 2020-07-28 LAB — WHITE CELL DIFFERENTIAL
Band Neutrophils: 0 %
Basophils Relative: 1.6 % (ref 0.0–3.0)
Eosinophils Relative: 0.9 % (ref 0.0–5.0)
Lymphocytes Relative: 17.7 % (ref 12.0–46.0)
Monocytes Relative: 5.4 % (ref 3.0–12.0)
Neutrophils Relative %: 74.4 % (ref 43.0–77.0)

## 2020-07-28 NOTE — Addendum Note (Signed)
Addended by: Alvina Chou on: 07/28/2020 08:05 AM   Modules accepted: Orders

## 2020-07-29 LAB — CYTOLOGY - PAP
Comment: NEGATIVE
Diagnosis: NEGATIVE
High risk HPV: NEGATIVE

## 2020-07-29 LAB — PATHOLOGIST SMEAR REVIEW

## 2020-07-29 MED ORDER — ESCITALOPRAM OXALATE 20 MG PO TABS: 20.0000 mg | ORAL_TABLET | Freq: Every day | ORAL | 3 refills | Status: DC

## 2020-07-29 MED ORDER — BUPROPION HCL ER (XL) 150 MG PO TB24: ORAL_TABLET | ORAL | 3 refills | Status: DC

## 2020-07-29 MED ORDER — CYANOCOBALAMIN 1000 MCG/ML IJ SOLN: 1000.0000 ug | INTRAMUSCULAR | 1 refills | Status: AC

## 2020-07-29 MED ORDER — DESOGESTREL-ETHINYL ESTRADIOL 0.15-0.02/0.01 MG (21/5) PO TABS: 1.0000 | ORAL_TABLET | Freq: Every day | ORAL | 3 refills | Status: DC

## 2020-07-29 MED ORDER — "SYRINGE/NEEDLE (DISP) 23G X 1"" 1 ML MISC": 1 refills | Status: AC

## 2020-07-29 MED ORDER — LEVOTHYROXINE SODIUM 50 MCG PO TABS: ORAL_TABLET | ORAL | 3 refills | Status: AC

## 2020-09-08 ENCOUNTER — Other Ambulatory Visit: Payer: Self-pay | Admitting: Primary Care

## 2020-09-08 DIAGNOSIS — Z3041 Encounter for surveillance of contraceptive pills: Secondary | ICD-10-CM

## 2021-01-10 DIAGNOSIS — E559 Vitamin D deficiency, unspecified: Secondary | ICD-10-CM

## 2021-01-10 DIAGNOSIS — E538 Deficiency of other specified B group vitamins: Secondary | ICD-10-CM

## 2021-04-12 ENCOUNTER — Telehealth: Payer: Self-pay | Admitting: Primary Care

## 2021-04-12 NOTE — Telephone Encounter (Signed)
Called patient not able to leave voicemail. Looks like a years worth was called in to pharmacy on 5/22. She should have refills there.

## 2021-04-12 NOTE — Telephone Encounter (Signed)
Pt called in stated her dog ate her birth control requesting refill ............ Encourage patient to contact the pharmacy for refills or they can request refills through [facility]  LAST APPOINTMENT DATE:  Please schedule appointment if longer than 1 year  NEXT APPOINTMENT DATE:  MEDICATION:VOLNEA 0.15-0.02/0.01 MG (21/5) tablet   Is the patient out of medication?   PHARMACY:[facility] [place]   Let patient know to contact pharmacy at the end of the day to make sure medication is ready.  Please notify patient to allow 48-72 hours to process  CLINICAL FILLS OUT ALL BELOW:   LAST REFILL:  QTY:  REFILL DATE:    OTHER COMMENTS:    Okay for refill?  Please advise

## 2021-04-14 NOTE — Telephone Encounter (Signed)
Mail box full not able to leave voice mail.  

## 2021-04-19 NOTE — Telephone Encounter (Signed)
Results have been relayed to the patient. The patient verbalized understanding. No questions at this time.   

## 2021-09-25 ENCOUNTER — Other Ambulatory Visit: Payer: Self-pay | Admitting: Primary Care

## 2021-09-25 DIAGNOSIS — Z3041 Encounter for surveillance of contraceptive pills: Secondary | ICD-10-CM

## 2021-09-25 NOTE — Telephone Encounter (Signed)
Last office visit 07/27/20 Last refill 09/08/20 #84/3 No upcoming appointment scheduled

## 2021-09-25 NOTE — Telephone Encounter (Signed)
Left a message on voicemail to call the office ASAP. Need to schedule an appointment with [name] NP.

## 2021-09-25 NOTE — Telephone Encounter (Signed)
Patient is out of birth control and needs it refilled today if possible.

## 2021-09-25 NOTE — Telephone Encounter (Signed)
Did she schedule an appointment? I haven't seen her since March 2022

## 2021-09-25 NOTE — Telephone Encounter (Signed)
Can  you okay a refill for one month since patient is out of her birth control.

## 2021-09-25 NOTE — Telephone Encounter (Signed)
Two messages have been left for patient to call the office back today. No appointment scheduled yet waiting for patient to call the office back.

## 2021-09-25 NOTE — Telephone Encounter (Signed)
Tried to call patient again and got her voicemail.

## 2021-09-25 NOTE — Telephone Encounter (Signed)
Left another message for patient to call the office back.

## 2021-09-26 NOTE — Telephone Encounter (Signed)
Noted. Refill request declined. Patient needs to schedule appointment.

## 2021-09-28 NOTE — Telephone Encounter (Signed)
Called patient one more time left message to call office. I have also sent my chart as well.

## 2021-12-15 ENCOUNTER — Ambulatory Visit
Admission: RE | Admit: 2021-12-15 | Discharge: 2021-12-15 | Disposition: A | Discharge status: Home / Self Care | Payer: 59 | Source: Ambulatory Visit | Attending: Obstetrics and Gynecology | Admitting: Obstetrics and Gynecology

## 2021-12-15 ENCOUNTER — Other Ambulatory Visit: Payer: Self-pay | Admitting: Obstetrics and Gynecology

## 2021-12-15 DIAGNOSIS — R7611 Nonspecific reaction to tuberculin skin test without active tuberculosis: Secondary | ICD-10-CM

## 2022-01-23 IMAGING — DX DG ELBOW COMPLETE 3+V*R*
4 series · 4 of 4 positions shown · non-contrast
Comparison: None.

CLINICAL DATA: 28-year-old female with fall and trauma to the right
elbow.

EXAM:
RIGHT ELBOW - COMPLETE 3+ VIEW

[elbow ap]
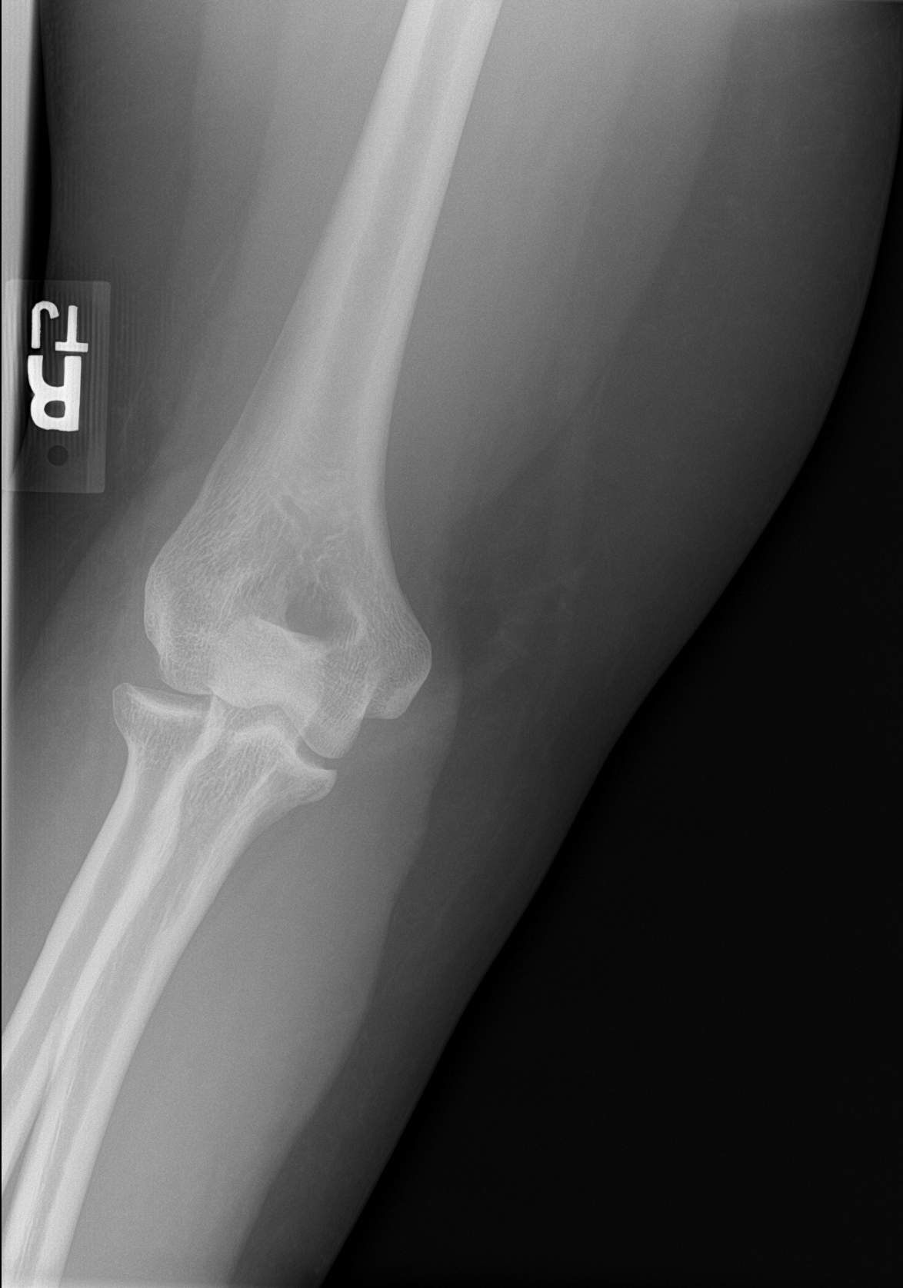

[elbow obl (1 of 2)]
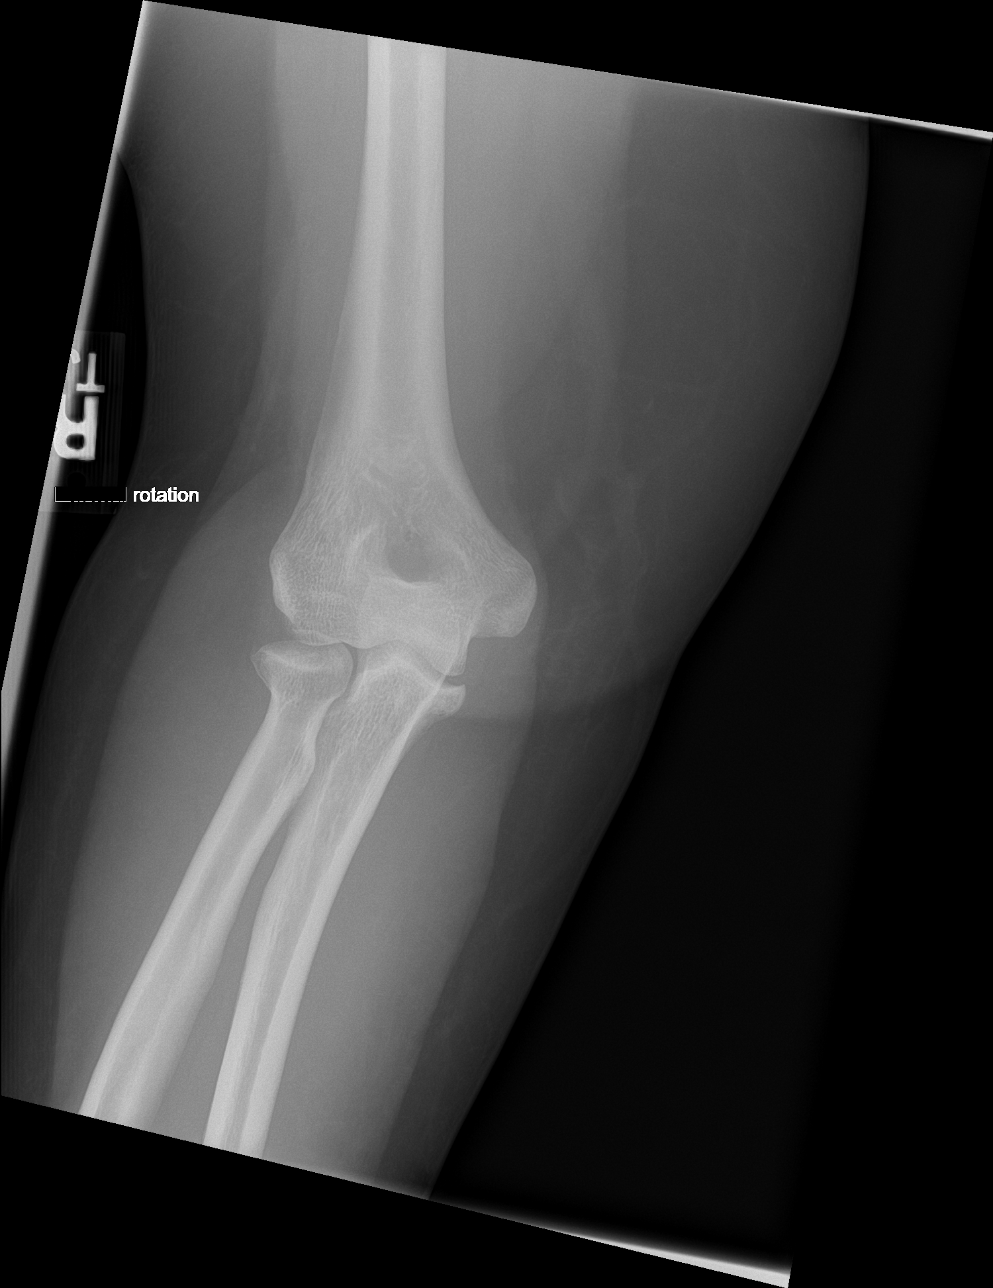

[elbow lat]
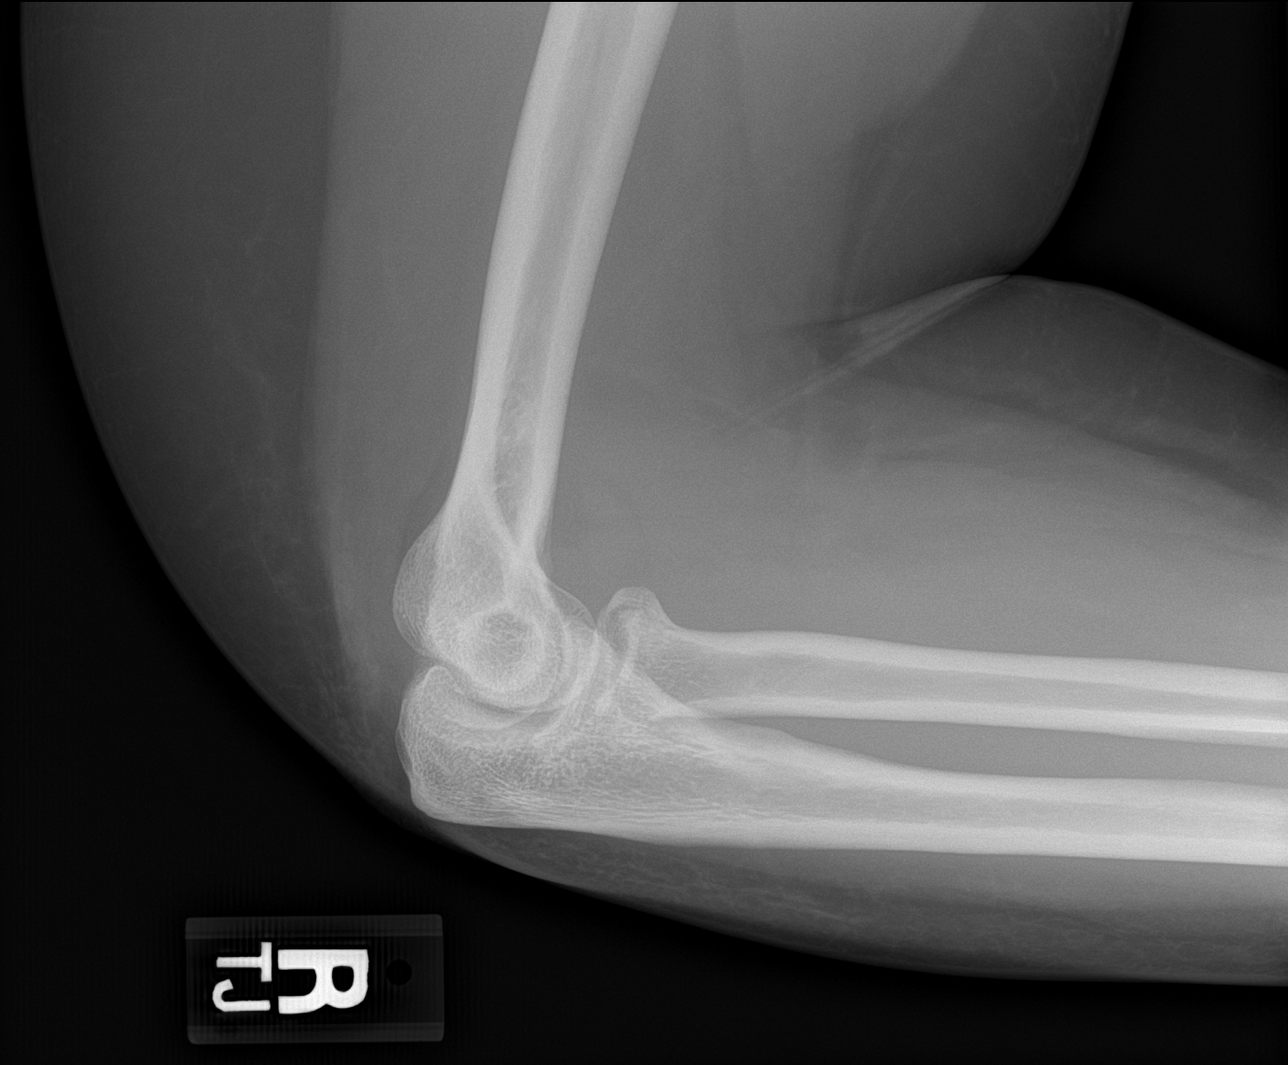

[elbow obl (2 of 2)]
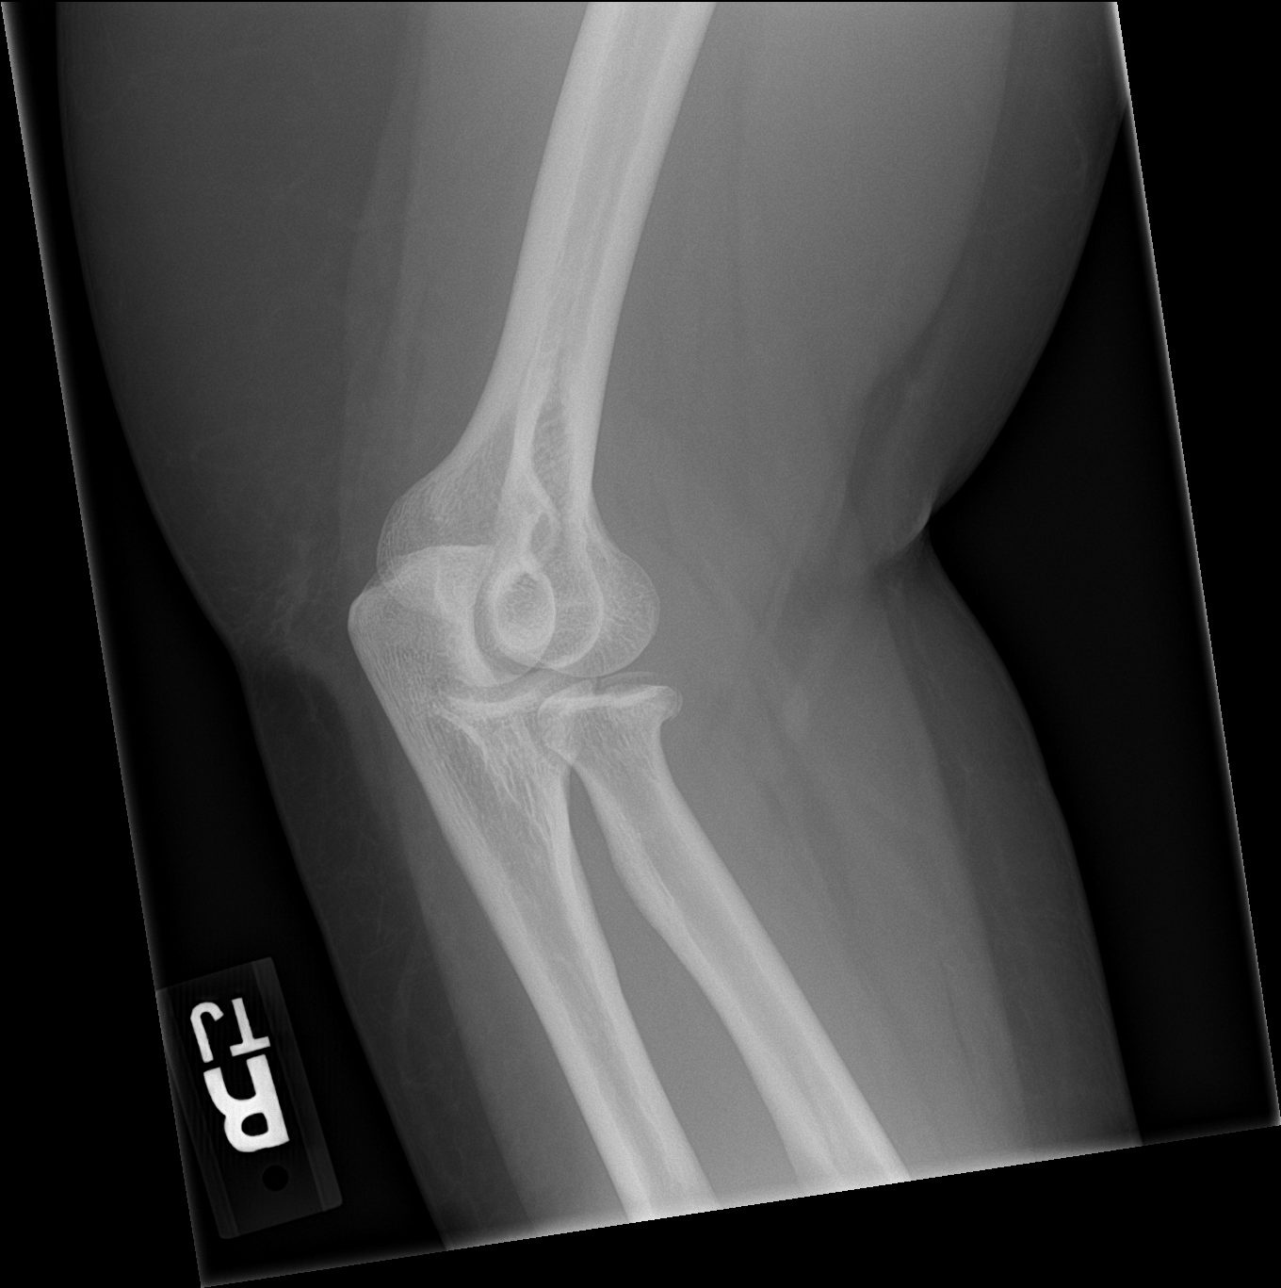

[4 of 4 positions shown; findings below may reference images not displayed]

FINDINGS: There is a minimally displaced fracture of the radial head with
involvement of the intra-articular surface. No other acute fracture
identified. There is no dislocation. The bones are well mineralized.
There is moderate joint effusion. The soft tissues are otherwise
unremarkable.
IMPRESSION: Minimally displaced, intra-articular fracture of the radial head.

## 2022-06-15 ENCOUNTER — Other Ambulatory Visit: Payer: Self-pay | Admitting: Family Medicine

## 2022-06-15 ENCOUNTER — Ambulatory Visit
Admission: RE | Admit: 2022-06-15 | Discharge: 2022-06-15 | Disposition: A | Discharge status: Home / Self Care | Payer: BC Managed Care – PPO | Source: Ambulatory Visit | Attending: Family Medicine | Admitting: Family Medicine

## 2022-06-15 ENCOUNTER — Other Ambulatory Visit: Payer: Self-pay

## 2022-06-15 DIAGNOSIS — M545 Low back pain, unspecified: Secondary | ICD-10-CM

## 2022-06-18 ENCOUNTER — Other Ambulatory Visit: Payer: Self-pay | Admitting: Family Medicine

## 2022-06-18 DIAGNOSIS — M545 Low back pain, unspecified: Secondary | ICD-10-CM

## 2022-06-18 DIAGNOSIS — G8911 Acute pain due to trauma: Secondary | ICD-10-CM

## 2022-07-09 NOTE — Progress Notes (Unsigned)
Referring Physician:  Peggye Form, NP [place],  [place] [address]  Primary Physician:  Peggye Form, NP  History of Present Illness: 07/12/2022 Stephanie Watson has a history of anxiety and thyroid disease.   She moved a 400 lb patient and felt okay, but then had some increased pain at the end of the day that got worse when she got in her car on 06/14/22.   She had constant LBP with intermittent left > right leg pain posterior to her feet that started after bending/twisting to get in car. No numbness or tingling. Some weakness in her legs.   Minimal pain after finishing prednisone taper. 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 No current back or leg pain. No numbness, tingling, or weakness.   She has also taken prn ultram. She is mostly taking tylenol.   She vapes.   She has seen improvement with prn ultram.   Bowel/Bladder Dysfunction: none  Conservative measures:  Physical therapy: none  Multimodal medical therapy including regular antiinflammatories: prednisone, ultram Injections: No epidural steroid injections  Past Surgery: no spinal surgery  Stephanie Watson has no symptoms of cervical myelopathy.  The symptoms are causing a significant impact on the patient's life.   Review of Systems:  A 10 point review of systems is negative, except for the pertinent positives and negatives detailed in the HPI.  Past Medical History: Past Medical History:  Diagnosis Date   Obesity     Past Surgical History: Past Surgical History:  Procedure Laterality Date   CESAREAN SECTION     CHOLECYSTECTOMY N/A 06/22/2012   Procedure: LAPAROSCOPIC CHOLECYSTECTOMY WITH INTRAOPERATIVE CHOLANGIOGRAM;  Surgeon: Edward Jolly, MD;  Location: [facility];  Service: General;  Laterality: N/A;   ERCP N/A 07/02/2012   Procedure: ENDOSCOPIC RETROGRADE CHOLANGIOPANCREATOGRAPHY (ERCP);  Surgeon: Inda Castle, MD;  Location: [facility];  Service:  Endoscopy;  Laterality: N/A;   ERCP N/A 07/02/2012   Procedure: ENDOSCOPIC RETROGRADE CHOLANGIOPANCREATOGRAPHY (ERCP);  Surgeon: Inda Castle, MD;  Location: [facility];  Service: Gastroenterology;  Laterality: N/A;    Allergies: Allergies as of 07/12/2022 - Review Complete 07/12/2022  Allergen Reaction Noted   Latex Swelling 05/08/2012   Penicillins Swelling 05/08/2012    Medications: Outpatient Encounter Medications as of 07/12/2022  Medication Sig   cyanocobalamin (,VITAMIN B-12,) 1000 MCG/ML injection Inject 1 mL (1,000 mcg total) into the muscle every 30 (thirty) days.   levothyroxine (SYNTHROID) 50 MCG tablet TAKE 1 TABLET EVERY MORNING ON AN EMPTY STOMACH WITH WATER ONLY. NO FOOD OR OTHER MEDICATIONS FOR 30 MINUTES.   SYRINGE/NEEDLE, DISP, 1 ML 23G X 1" 1 ML MISC Use as directed   VOLNEA 0.15-0.02/0.01 MG (21/5) tablet TAKE 1 TABLET DAILY   [DISCONTINUED] buPROPion (WELLBUTRIN XL) 150 MG 24 hr tablet TAKE 1 TABLET DAILY FOR ANXIETY AND DEPRESSION (Patient not taking: Reported on 07/12/2022)   [DISCONTINUED] escitalopram (LEXAPRO) 20 MG tablet Take 1 tablet (20 mg total) by mouth daily. For anxiety. (Patient not taking: Reported on 07/12/2022)   No facility-administered encounter medications on file as of 07/12/2022.    Social History: Social History   Tobacco Use   Smoking status: Never   Smokeless tobacco: Never  Substance Use Topics   Alcohol use: No   Drug use: No    Family Medical History: Family History  Problem Relation Age of Onset   Diabetes Mellitus II Mother    Hyperlipidemia Father  Melanoma Maternal Grandmother    Heart attack Maternal Grandmother    Heart attack Paternal Grandfather     Physical Examination: Vitals:   07/12/22 0858  BP: 116/78    General: Patient is well developed, well nourished, calm, collected, and in no apparent distress. Attention to examination is appropriate.  Respiratory: Patient is breathing without any  difficulty.   NEUROLOGICAL:     Awake, alert, oriented to person, place, and time.  Speech is clear and fluent. Fund of knowledge is appropriate.   Cranial Nerves: Pupils equal round and reactive to light.  Facial tone is symmetric.    Good ROM of lumbar spine without pain No posterior lumbar tenderness.   No abnormal lesions on exposed skin.   Strength: Side Biceps Triceps Deltoid Interossei Grip Wrist Ext. Wrist Flex.  R '5 5 5 5 5 5 5  '$ L '5 5 5 5 5 5 5   '$ Side Iliopsoas Quads Hamstring PF DF EHL  R '5 5 5 5 5 5  '$ L '5 5 5 5 5 5   '$ Reflexes are 2+ and symmetric at the biceps, triceps, brachioradialis, patella and achilles.   Hoffman's is absent.  Clonus is not present.   Bilateral upper and lower extremity sensation is intact to light touch.     Gait is normal.    Medical Decision Making  Imaging: Xrays of lumbar spine dated 06/15/22:  Mild lumbar spondylosis  No radiology report available for above xrays.   CT of lumbar spine dated 06/15/22:  FINDINGS: Segmentation: There is transitional lumbosacral anatomy. Based on the prior chest radiograph, the ribs at T12 are small, and therefore the transitional segment is considered a partially sacralized L5.   Alignment: Straightening of the normal lumbar lordosis. No listhesis.   Vertebrae: No fracture or suspicious osseous lesion.   Paraspinal and other soft tissues: Unremarkable.   Disc levels:   T11-12 through L2-3: Negative.   L3-4: A left foraminal disc osteophyte complex results in moderate left neural foraminal stenosis. Patent spinal canal and right neural foramen.   L4-5: Minor disc bulging and a suspected small left subarticular disc protrusion likely result in mild left lateral recess stenosis. No generalized spinal stenosis or significant neural foraminal stenosis.   L5-S1: Transitional anatomy with mildly hypoplastic disc. No stenosis.   IMPRESSION: 1. Transitional lumbosacral anatomy as above. 2.  Moderate left neural foraminal stenosis at L3-4 due to a disc osteophyte complex. 3. Suspected small left subarticular disc protrusion at L4-5.     Electronically Signed   By: Logan Bores M.D.   On: 06/15/2022 15:59  I have personally reviewed the images and agree with the above interpretation.  Assessment and Plan: Stephanie Watson is a pleasant 32 y.o. female moved a 400 lb patient on 06/15/22 and felt okay, but then had some increased pain at the end of the day that got worse when she got in her car.   She had constant LBP with intermittent left > right leg pain posterior to her feet that started after bending/twisting to get in car.   Minimal pain after finishing prednisone taper. 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 No current back or leg pain. No numbness, tingling, or weakness.   She has mild lumbar spondylosis with disc bulging L3-L5.   Treatment options discussed with patient and following plan made:   - Recommend PT for lumbar spine. She declines. Given lumbar HEP to do on her own.  -  Weight loss discussed and encouraged. She has been working on this and will continue.  - She is vaping with nicotine. Smoking cessation discussed and encouraged. She is working on this a well.  - Reviewed care with bending, twisting, and lifting.  - She will f/u prn with me.   I spent a total of 20 minutes in face-to-face and non-face-to-face activities related to this patient's care today including review of outside records, review of imaging, review of symptoms, physical exam, discussion of differential diagnosis, discussion of treatment options, and documentation.   Thank you for involving me in the care of this patient.   Geronimo Boot PA-C Dept. of Neurosurgery

## 2022-07-10 ENCOUNTER — Other Ambulatory Visit: Payer: Self-pay

## 2022-07-10 ENCOUNTER — Ambulatory Visit
Admission: RE | Admit: 2022-07-10 | Discharge: 2022-07-10 | Disposition: A | Discharge status: Home / Self Care | Payer: Self-pay | Source: Ambulatory Visit | Attending: Orthopedic Surgery | Admitting: Orthopedic Surgery

## 2022-07-10 DIAGNOSIS — Z049 Encounter for examination and observation for unspecified reason: Secondary | ICD-10-CM

## 2022-07-12 ENCOUNTER — Encounter: Payer: Self-pay | Admitting: Orthopedic Surgery

## 2022-07-12 ENCOUNTER — Ambulatory Visit: Payer: BC Managed Care – PPO | Admitting: Orthopedic Surgery

## 2022-07-12 VITALS — BP 116/78 | Ht 60.0 in | Wt 191.4 lb

## 2022-07-12 DIAGNOSIS — M47816 Spondylosis without myelopathy or radiculopathy, lumbar region: Secondary | ICD-10-CM

## 2022-07-12 NOTE — Patient Instructions (Addendum)
It was so nice to see you today. Thank you so much for coming in.    Your imaging showed some very mild wear and tear in your back.    I gave you some exercises for your lower back. Do these a few times a week. Stop any that make your pain worse.   Continue to work on weight loss and quitting smoking. You are doing great!  Please do not hesitate to call if you have any questions or concerns. You can also message me in [place].   Geronimo Boot PA-C [phone]

## 2024-03-23 ENCOUNTER — Encounter (HOSPITAL_COMMUNITY): Payer: Self-pay

## 2024-03-23 ENCOUNTER — Other Ambulatory Visit: Payer: Self-pay

## 2024-03-23 ENCOUNTER — Emergency Department (HOSPITAL_COMMUNITY)

## 2024-03-23 ENCOUNTER — Emergency Department (HOSPITAL_COMMUNITY)
Admission: EM | Admit: 2024-03-23 | Discharge: 2024-03-24 | Disposition: A | Discharge status: Home / Self Care | Attending: Emergency Medicine | Admitting: Emergency Medicine

## 2024-03-23 DIAGNOSIS — Z91040 Latex allergy status: Secondary | ICD-10-CM | POA: Diagnosis not present

## 2024-03-23 DIAGNOSIS — K2980 Duodenitis without bleeding: Secondary | ICD-10-CM | POA: Insufficient documentation

## 2024-03-23 DIAGNOSIS — R1011 Right upper quadrant pain: Secondary | ICD-10-CM | POA: Diagnosis present

## 2024-03-23 LAB — COMPREHENSIVE METABOLIC PANEL WITH GFR
ALT: 19 U/L (ref 0–44)
AST: 22 U/L (ref 15–41)
Albumin: 3.6 g/dL (ref 3.5–5.0)
Alkaline Phosphatase: 84 U/L (ref 38–126)
Anion gap: 14 (ref 5–15)
BUN: 10 mg/dL (ref 6–20)
CO2: 18 mmol/L — ABNORMAL LOW (ref 22–32)
Calcium: 9.1 mg/dL (ref 8.9–10.3)
Chloride: 106 mmol/L (ref 98–111)
Creatinine, Ser: 0.82 mg/dL (ref 0.44–1.00)
GFR, Estimated: >60 mL/min (ref >60)
Glucose, Bld: 114 mg/dL — ABNORMAL HIGH (ref 70–99)
Potassium: 3.5 mmol/L (ref 3.5–5.1)
Sodium: 138 mmol/L (ref 135–145)
Total Bilirubin: 0.5 mg/dL (ref 0.0–1.2)
Total Protein: 7.9 g/dL (ref 6.5–8.1)

## 2024-03-23 LAB — CBG MONITORING, ED: Glucose-Capillary: 100 mg/dL — ABNORMAL HIGH (ref 70–99)

## 2024-03-23 LAB — I-STAT CHEM 8, ED
BUN: 10 mg/dL (ref 6–20)
Calcium, Ion: 1.07 mmol/L — ABNORMAL LOW (ref 1.15–1.40)
Chloride: 109 mmol/L (ref 98–111)
Creatinine, Ser: 0.7 mg/dL (ref 0.44–1.00)
Glucose, Bld: 116 mg/dL — ABNORMAL HIGH (ref 70–99)
HCT: 40 % (ref 36.0–46.0)
Hemoglobin: 13.6 g/dL (ref 12.0–15.0)
Potassium: 3.6 mmol/L (ref 3.5–5.1)
Sodium: 140 mmol/L (ref 135–145)
TCO2: 18 mmol/L — ABNORMAL LOW (ref 22–32)

## 2024-03-23 LAB — HCG, SERUM, QUALITATIVE: Preg, Serum: NEGATIVE

## 2024-03-23 LAB — CBC
HCT: 40 % (ref 36.0–46.0)
Hemoglobin: 13.8 g/dL (ref 12.0–15.0)
MCH: 30.6 pg (ref 26.0–34.0)
MCHC: 34.5 g/dL (ref 30.0–36.0)
MCV: 88.7 fL (ref 80.0–100.0)
Platelets: 298 K/uL (ref 150–400)
RBC: 4.51 MIL/uL (ref 3.87–5.11)
RDW: 11.8 % (ref 11.5–15.5)
WBC: 17.2 K/uL — ABNORMAL HIGH (ref 4.0–10.5)
nRBC: 0 % (ref 0.0–0.2)

## 2024-03-23 LAB — URINALYSIS, ROUTINE W REFLEX MICROSCOPIC
Bilirubin Urine: NEGATIVE
Glucose, UA: NEGATIVE mg/dL
Ketones, ur: NEGATIVE mg/dL
Leukocytes,Ua: NEGATIVE
Nitrite: NEGATIVE
Protein, ur: NEGATIVE mg/dL
Specific Gravity, Urine: 1.025 (ref 1.005–1.030)
pH: 6 (ref 5.0–8.0)

## 2024-03-23 LAB — TROPONIN I (HIGH SENSITIVITY)
Troponin I (High Sensitivity): 3 ng/L (ref <18)
Troponin I (High Sensitivity): <2 ng/L (ref <18)

## 2024-03-23 LAB — MAGNESIUM: Magnesium: 2 mg/dL (ref 1.7–2.4)

## 2024-03-23 LAB — LIPASE, BLOOD: Lipase: 28 U/L (ref 11–51)

## 2024-03-23 MED ORDER — SODIUM CHLORIDE 0.9 % IV SOLN: INTRAVENOUS | Status: DC

## 2024-03-23 MED ORDER — FAMOTIDINE IN NACL 20-0.9 MG/50ML-% IV SOLN
20.0000 mg | Freq: Once | INTRAVENOUS | Status: AC
  Administered 2024-03-24: 20 mg via INTRAVENOUS
  Filled 2024-03-23: qty 50

## 2024-03-23 MED ORDER — ALUM & MAG HYDROXIDE-SIMETH 200-200-20 MG/5ML PO SUSP
30.0000 mL | Freq: Once | ORAL | Status: AC
  Administered 2024-03-24: 30 mL via ORAL
  Filled 2024-03-23: qty 30

## 2024-03-23 MED ORDER — HYDROMORPHONE HCL 1 MG/ML IJ SOLN
1.0000 mg | Freq: Once | INTRAMUSCULAR | Status: AC
  Administered 2024-03-23: 1 mg via INTRAVENOUS
  Filled 2024-03-23: qty 1

## 2024-03-23 MED ORDER — IOHEXOL 350 MG/ML SOLN
75.0000 mL | Freq: Once | INTRAVENOUS | Status: AC | PRN
  Administered 2024-03-23: 75 mL via INTRAVENOUS

## 2024-03-23 MED ORDER — ONDANSETRON HCL 4 MG/2ML IJ SOLN
4.0000 mg | Freq: Once | INTRAMUSCULAR | Status: AC
  Administered 2024-03-24: 4 mg via INTRAVENOUS
  Filled 2024-03-23: qty 2

## 2024-03-23 MED ORDER — CIPROFLOXACIN HCL 500 MG PO TABS
500.0000 mg | ORAL_TABLET | Freq: Two times a day (BID) | ORAL | 0 refills | Status: AC
Start: 2024-03-23 — End: ?

## 2024-03-23 MED ORDER — MORPHINE SULFATE (PF) 2 MG/ML IV SOLN
4.0000 mg | Freq: Once | INTRAVENOUS | Status: AC
  Administered 2024-03-23: 4 mg via INTRAVENOUS
  Filled 2024-03-23: qty 2

## 2024-03-23 MED ORDER — METRONIDAZOLE 500 MG PO TABS
500.0000 mg | ORAL_TABLET | Freq: Three times a day (TID) | ORAL | 0 refills | Status: AC
Start: 2024-03-23 — End: ?

## 2024-03-23 MED ORDER — KETOROLAC TROMETHAMINE 30 MG/ML IJ SOLN
15.0000 mg | Freq: Once | INTRAMUSCULAR | Status: AC
  Administered 2024-03-24: 15 mg via INTRAVENOUS
  Filled 2024-03-23: qty 1

## 2024-03-23 MED ORDER — SODIUM CHLORIDE 0.9 % IV BOLUS
500.0000 mL | Freq: Once | INTRAVENOUS | Status: AC
  Administered 2024-03-24: 500 mL via INTRAVENOUS

## 2024-03-23 NOTE — ED Notes (Signed)
 Pt reports in triage that she was in an ant nest a few days ago & is no sure if that is related or not to her s/s today.

## 2024-03-23 NOTE — ED Provider Triage Note (Signed)
 Emergency Medicine Provider Triage Evaluation Note  Stephanie Watson , a 33 y.o. female  was evaluated in triage.  Pt complains of pain that originates in the epigastrium and radiates into the chest.  Worsened with ambulation.  Endorses shortness of breath along with chest discomfort, does not radiate to the back.  Decreased appetite, endorses nausea..  Review of Systems  Positive: As above Negative:   Physical Exam  BP (!) 138/92 (BP Location: Right Arm)   Pulse (!) 114   Temp 98.2 F (36.8 C)   Resp 20   Ht 5' (1.524 m)   Wt 95.3 kg   SpO2 100%   BMI 41.01 kg/m  Gen:   Awake, no distress   Resp:  Normal effort  MSK:   Moves extremities without difficulty  Other:  Exquisitely tender to palpation of the right and left upper quadrants.  Medical Decision Making  Medically screening exam initiated at 4:11 PM.  Appropriate orders placed.  Stephanie Watson was informed that the remainder of the evaluation will be completed by another provider, this initial triage assessment does not replace that evaluation, and the importance of remaining in the ED until their evaluation is complete.  Initial imaging and lab orders placed.   Stephanie Watson, Stephanie Watson 03/23/24 [phone]

## 2024-03-23 NOTE — ED Triage Notes (Signed)
 Patient in ED with complaints of stomach and chest pain. She reports she thinks its mainly the abdominal pain radiating up. She states it started at 5am yesterday and is getting worse. It stopped in the middle of the night and flared back up today. She reports nausea and shob. She denies vomiting, diarrhea, fevers.

## 2024-03-24 MED ORDER — OMEPRAZOLE 20 MG PO CPDR: 20.0000 mg | DELAYED_RELEASE_CAPSULE | Freq: Every day | ORAL | 0 refills | Status: AC

## 2024-03-24 MED ORDER — CIPROFLOXACIN HCL 500 MG PO TABS
500.0000 mg | ORAL_TABLET | Freq: Once | ORAL | Status: AC
  Administered 2024-03-24: 500 mg via ORAL
  Filled 2024-03-24: qty 1

## 2024-03-24 MED ORDER — METRONIDAZOLE 500 MG PO TABS
500.0000 mg | ORAL_TABLET | Freq: Once | ORAL | Status: AC
  Administered 2024-03-24: 500 mg via ORAL
  Filled 2024-03-24: qty 1

## 2024-03-24 NOTE — ED Provider Notes (Signed)
 [facility] Provider Note   CSN: [id number] Arrival date & time: 03/23/24  1546     Patient presents with: Abdominal Pain and Chest Pain   Zanaya B Pettis is a 33 y.o. female.   The history is provided by the patient.  Abdominal Pain Pain location:  RUQ Pain radiates to:  Epigastric region Pain severity:  Severe Timing:  Constant Progression:  Worsening Chronicity:  New Context: not alcohol use   Relieved by:  Nothing Worsened by:  Nothing Ineffective treatments:  None tried Associated symptoms: nausea and vomiting   Associated symptoms: no anorexia, no belching, no constipation, no diarrhea, no dysuria, no fever and no hematemesis   Risk factors: no alcohol abuse        Prior to Admission medications   Medication Sig Start Date End Date Taking? Authorizing Provider  ciprofloxacin  (CIPRO ) 500 MG tablet Take 1 tablet (500 mg total) by mouth 2 (two) times daily. One po bid x 7 days 03/23/24  Yes Jehu Mccauslin, MD  metroNIDAZOLE  (FLAGYL ) 500 MG tablet Take 1 tablet (500 mg total) by mouth 3 (three) times daily. 03/23/24  Yes Vernie Vinciguerra, MD  omeprazole (PRILOSEC) 20 MG capsule Take 1 capsule (20 mg total) by mouth daily. 03/24/24  Yes Tamaira Ciriello, MD  cyanocobalamin  (,VITAMIN B-12,) 1000 MCG/ML injection Inject 1 mL (1,000 mcg total) into the muscle every 30 (thirty) days. 07/29/20   Clark, Katherine K, NP  levothyroxine  (SYNTHROID ) 50 MCG tablet TAKE 1 TABLET EVERY MORNING ON AN EMPTY STOMACH WITH WATER ONLY. NO FOOD OR OTHER MEDICATIONS FOR 30 MINUTES. 07/29/20   Clark, Katherine K, NP  SYRINGE/NEEDLE, DISP, 1 ML 23G X 1 1 ML MISC Use as directed 07/29/20   Clark, Katherine K, NP  VOLNEA  0.15-0.02/0.01 MG (21/5) tablet TAKE 1 TABLET DAILY 09/08/20   Velma Raisin, MD    Allergies: Latex and Penicillins    Review of Systems  Constitutional:  Negative for fever.  Gastrointestinal:  Positive for nausea and vomiting. Negative  for anorexia, constipation, diarrhea and hematemesis.  Genitourinary:  Negative for dysuria.  All other systems reviewed and are negative.   Updated Vital Signs BP (!) 109/52   Pulse 94   Temp 98.5 F (36.9 C)   Resp 18   Ht 5' (1.524 m)   Wt 95.3 kg   SpO2 98%   BMI 41.01 kg/m   Physical Exam Vitals and nursing note reviewed.  Constitutional:      General: She is not in acute distress.    Appearance: Normal appearance. She is well-developed.  HENT:     Head: Normocephalic and atraumatic.     Nose: Nose normal.  Eyes:     Pupils: Pupils are equal, round, and reactive to light.  Cardiovascular:     Rate and Rhythm: Normal rate and regular rhythm.     Pulses: Normal pulses.     Heart sounds: Normal heart sounds.  Pulmonary:     Effort: Pulmonary effort is normal. No respiratory distress.     Breath sounds: Normal breath sounds.  Abdominal:     General: Bowel sounds are normal. There is no distension.     Palpations: Abdomen is soft.     Tenderness: There is no abdominal tenderness. There is no guarding or rebound.  Musculoskeletal:        General: Normal range of motion.     Cervical back: Normal range of motion and neck supple.  Skin:  General: Skin is warm and dry.     Capillary Refill: Capillary refill takes less than 2 seconds.     Findings: No erythema or rash.  Neurological:     General: No focal deficit present.     Mental Status: She is alert and oriented to person, place, and time.     Deep Tendon Reflexes: Reflexes normal.  Psychiatric:        Mood and Affect: Mood normal.     (all labs ordered are listed, but only abnormal results are displayed) Results for orders placed or performed during the hospital encounter of 03/23/24  CBG monitoring, ED   Collection Time: 03/23/24  4:20 PM  Result Value Ref Range   Glucose-Capillary 100 (H) 70 - 99 mg/dL  Comprehensive metabolic panel   Collection Time: 03/23/24  4:22 PM  Result Value Ref Range    Sodium 138 135 - 145 mmol/L   Potassium 3.5 3.5 - 5.1 mmol/L   Chloride 106 98 - 111 mmol/L   CO2 18 (L) 22 - 32 mmol/L   Glucose, Bld 114 (H) 70 - 99 mg/dL   BUN 10 6 - 20 mg/dL   Creatinine, Ser 9.17 0.44 - 1.00 mg/dL   Calcium 9.1 8.9 - 89.6 mg/dL   Total Protein 7.9 6.5 - 8.1 g/dL   Albumin 3.6 3.5 - 5.0 g/dL   AST 22 15 - 41 U/L   ALT 19 0 - 44 U/L   Alkaline Phosphatase 84 38 - 126 U/L   Total Bilirubin 0.5 0.0 - 1.2 mg/dL   GFR, Estimated >39 >39 mL/min   Anion gap 14 5 - 15  CBC   Collection Time: 03/23/24  4:22 PM  Result Value Ref Range   WBC 17.2 (H) 4.0 - 10.5 K/uL   RBC 4.51 3.87 - 5.11 MIL/uL   Hemoglobin 13.8 12.0 - 15.0 g/dL   HCT 59.9 63.9 - 53.9 %   MCV 88.7 80.0 - 100.0 fL   MCH 30.6 26.0 - 34.0 pg   MCHC 34.5 30.0 - 36.0 g/dL   RDW 88.1 88.4 - 84.4 %   Platelets 298 150 - 400 K/uL   nRBC 0.0 0.0 - 0.2 %  hCG, serum, qualitative   Collection Time: 03/23/24  4:22 PM  Result Value Ref Range   Preg, Serum NEGATIVE NEGATIVE  Magnesium   Collection Time: 03/23/24  4:22 PM  Result Value Ref Range   Magnesium 2.0 1.7 - 2.4 mg/dL  Lipase, blood   Collection Time: 03/23/24  4:22 PM  Result Value Ref Range   Lipase 28 11 - 51 U/L  Troponin I (High Sensitivity)   Collection Time: 03/23/24  4:22 PM  Result Value Ref Range   Troponin I (High Sensitivity) 3 <18 ng/L  I-stat chem 8, ED   Collection Time: 03/23/24  4:24 PM  Result Value Ref Range   Sodium 140 135 - 145 mmol/L   Potassium 3.6 3.5 - 5.1 mmol/L   Chloride 109 98 - 111 mmol/L   BUN 10 6 - 20 mg/dL   Creatinine, Ser 9.29 0.44 - 1.00 mg/dL   Glucose, Bld 883 (H) 70 - 99 mg/dL   Calcium, Ion 1.07 (L) 1.15 - 1.40 mmol/L   TCO2 18 (L) 22 - 32 mmol/L   Hemoglobin 13.6 12.0 - 15.0 g/dL   HCT 59.9 63.9 - 53.9 %  Urinalysis, Routine w reflex microscopic -Urine, Clean Catch   Collection Time: 03/23/24  5:50 PM  Result Value  Ref Range   Color, Urine YELLOW YELLOW   APPearance CLEAR CLEAR   Specific  Gravity, Urine 1.025 1.005 - 1.030   pH 6.0 5.0 - 8.0   Glucose, UA NEGATIVE NEGATIVE mg/dL   Hgb urine dipstick SMALL (A) NEGATIVE   Bilirubin Urine NEGATIVE NEGATIVE   Ketones, ur NEGATIVE NEGATIVE mg/dL   Protein, ur NEGATIVE NEGATIVE mg/dL   Nitrite NEGATIVE NEGATIVE   Leukocytes,Ua NEGATIVE NEGATIVE   RBC / HPF 0-5 0 - 5 RBC/hpf   WBC, UA 0-5 0 - 5 WBC/hpf   Bacteria, UA RARE (A) NONE SEEN   Squamous Epithelial / HPF 0-5 0 - 5 /HPF  Troponin I (High Sensitivity)   Collection Time: 03/23/24  6:24 PM  Result Value Ref Range   Troponin I (High Sensitivity) <2 <18 ng/L   CT ABDOMEN PELVIS W CONTRAST Result Date: 03/23/2024 CLINICAL DATA:  Abdominal pain. EXAM: CT ABDOMEN AND PELVIS WITH CONTRAST TECHNIQUE: Multidetector CT imaging of the abdomen and pelvis was performed using the standard protocol following bolus administration of intravenous contrast. RADIATION DOSE REDUCTION: This exam was performed according to the departmental dose-optimization program which includes automated exposure control, adjustment of the mA and/or kV according to patient size and/or use of iterative reconstruction technique. CONTRAST:  75mL OMNIPAQUE  IOHEXOL  350 MG/ML SOLN COMPARISON:  None Available. FINDINGS: Lower chest: No acute abnormality. Hepatobiliary: No focal liver abnormality is seen. Status post cholecystectomy. The common bile duct measures 8.9 mm in diameter. A very mild amount of inflammatory fat stranding is seen along the inferior aspect of the common bile duct and adjacent portion of the duodenum Pancreas: Unremarkable. No pancreatic ductal dilatation or surrounding inflammatory changes. Spleen: Normal in size without focal abnormality. Adrenals/Urinary Tract: Adrenal glands are unremarkable. Kidneys are normal, without renal calculi, focal lesion, or hydronephrosis. Bladder is unremarkable. Stomach/Bowel: Stomach is within normal limits. Appendix appears normal. No evidence of bowel wall  thickening, distention, or inflammatory changes. Vascular/Lymphatic: No significant vascular findings are present. No enlarged abdominal or pelvic lymph nodes. Reproductive: Uterus and bilateral adnexa are unremarkable. Other: No abdominal wall hernia or abnormality. No abdominopelvic ascites. Musculoskeletal: No acute or significant osseous findings. IMPRESSION: 1. Evidence of prior cholecystectomy. 2. Very mild amount of inflammatory fat stranding along the inferior aspect of the common bile duct and adjacent portion of the duodenum. This may represent sequelae associated with mild duodenitis. Correlation with follow-up abdomen and pelvis CT is recommended to determine stability or resolution. Electronically Signed   By: Suzen Dials M.D.   On: 03/23/2024 19:04   DG Chest 1 View Result Date: 03/23/2024 CLINICAL DATA:  Chest pain EXAM: CHEST  1 VIEW COMPARISON:  Chest x-ray 12/15/2021 FINDINGS: The heart size and mediastinal contours are within normal limits. Both lungs are clear. The visualized skeletal structures are unremarkable. IMPRESSION: No active disease. Electronically Signed   By: Greig Pique M.D.   On: 03/23/2024 18:54    EKG: EKG Interpretation Date/Time:  Monday March 23 2024 16:08:34 EST Ventricular Rate:  102 PR Interval:  146 QRS Duration:  68 QT Interval:  338 QTC Calculation: 440 R Axis:   29  Text Interpretation: Sinus tachycardia Confirmed by Juwana Thoreson ([id number]) on 03/23/2024 11:02:45 PM  Radiology: CT ABDOMEN PELVIS W CONTRAST Result Date: 03/23/2024 CLINICAL DATA:  Abdominal pain. EXAM: CT ABDOMEN AND PELVIS WITH CONTRAST TECHNIQUE: Multidetector CT imaging of the abdomen and pelvis was performed using the standard protocol following bolus administration of intravenous contrast. RADIATION DOSE REDUCTION: This  exam was performed according to the departmental dose-optimization program which includes automated exposure control, adjustment of the mA and/or kV  according to patient size and/or use of iterative reconstruction technique. CONTRAST:  75mL OMNIPAQUE  IOHEXOL  350 MG/ML SOLN COMPARISON:  None Available. FINDINGS: Lower chest: No acute abnormality. Hepatobiliary: No focal liver abnormality is seen. Status post cholecystectomy. The common bile duct measures 8.9 mm in diameter. A very mild amount of inflammatory fat stranding is seen along the inferior aspect of the common bile duct and adjacent portion of the duodenum Pancreas: Unremarkable. No pancreatic ductal dilatation or surrounding inflammatory changes. Spleen: Normal in size without focal abnormality. Adrenals/Urinary Tract: Adrenal glands are unremarkable. Kidneys are normal, without renal calculi, focal lesion, or hydronephrosis. Bladder is unremarkable. Stomach/Bowel: Stomach is within normal limits. Appendix appears normal. No evidence of bowel wall thickening, distention, or inflammatory changes. Vascular/Lymphatic: No significant vascular findings are present. No enlarged abdominal or pelvic lymph nodes. Reproductive: Uterus and bilateral adnexa are unremarkable. Other: No abdominal wall hernia or abnormality. No abdominopelvic ascites. Musculoskeletal: No acute or significant osseous findings. IMPRESSION: 1. Evidence of prior cholecystectomy. 2. Very mild amount of inflammatory fat stranding along the inferior aspect of the common bile duct and adjacent portion of the duodenum. This may represent sequelae associated with mild duodenitis. Correlation with follow-up abdomen and pelvis CT is recommended to determine stability or resolution. Electronically Signed   By: Suzen Dials M.D.   On: 03/23/2024 19:04   DG Chest 1 View Result Date: 03/23/2024 CLINICAL DATA:  Chest pain EXAM: CHEST  1 VIEW COMPARISON:  Chest x-ray 12/15/2021 FINDINGS: The heart size and mediastinal contours are within normal limits. Both lungs are clear. The visualized skeletal structures are unremarkable. IMPRESSION: No  active disease. Electronically Signed   By: Greig Pique M.D.   On: 03/23/2024 18:54     Procedures   Medications Ordered in the ED  0.9 %  sodium chloride  infusion (0 mLs Intravenous Stopped 03/24/24 0157)  morphine  (PF) 2 MG/ML injection 4 mg (4 mg Intravenous Given 03/23/24 1625)  iohexol  (OMNIPAQUE ) 350 MG/ML injection 75 mL (75 mLs Intravenous Contrast Given 03/23/24 1746)  HYDROmorphone  (DILAUDID ) injection 1 mg (1 mg Intravenous Given 03/23/24 1857)  ketorolac  (TORADOL ) 30 MG/ML injection 15 mg (15 mg Intravenous Given 03/24/24 0049)  alum & mag hydroxide-simeth (MAALOX/MYLANTA) 200-200-20 MG/5ML suspension 30 mL (30 mLs Oral Given 03/24/24 0049)  ondansetron  (ZOFRAN ) injection 4 mg (4 mg Intravenous Given 03/24/24 0049)  sodium chloride  0.9 % bolus 500 mL (0 mLs Intravenous Stopped 03/24/24 0157)  famotidine (PEPCID) IVPB 20 mg premix (0 mg Intravenous Stopped 03/24/24 0157)  ciprofloxacin  (CIPRO ) tablet 500 mg (500 mg Oral Given 03/24/24 0205)  metroNIDAZOLE  (FLAGYL ) tablet 500 mg (500 mg Oral Given 03/24/24 0205)                                    Medical Decision Making Patient with RUQ pain and one episode of emesis   Amount and/or Complexity of Data Reviewed Independent Historian: spouse    Details: See above  External Data Reviewed: notes.    Details: Previous notes reviewed  Labs: ordered.    Details: Urine is negative for uti. Troponin is is negative 2.  Normal sodium 140, normal potassium 3.6, normal creatinine.  Normal lipase 28, pregnancy is negative. Elevated white count 17.2  Radiology: ordered and independent interpretation performed.    Details: No  SBO by me  ECG/medicine tests: ordered and independent interpretation performed. Decision-making details documented in ED Course.  Risk OTC drugs. Prescription drug management. Risk Details: Patient with duodenitis on CT.  While this is likely viral in nature will cover with antibiotics given high white  count.  Exam is benign and reassuring.  QT on EKG is normal.  Will start cipro  and flagyl  and prilosec for duodenitis and refer to GI for ongoing care.  Stable for discharge.  Strict returns.       Final diagnoses:  Duodenitis   No signs of systemic illness or infection. The patient is nontoxic-appearing on exam and vital signs are within normal limits.  I have reviewed the triage vital signs and the nursing notes. Pertinent labs & imaging results that were available during my care of the patient were reviewed by me and considered in my medical decision making (see chart for details). After history, exam, and medical workup I feel the patient has been appropriately medically screened and is safe for discharge home. Pertinent diagnoses were discussed with the patient. Patient was given return precautions.  ED Discharge Orders          Ordered    omeprazole (PRILOSEC) 20 MG capsule  Daily        03/24/24 0236    ciprofloxacin  (CIPRO ) 500 MG tablet  2 times daily        03/23/24 2348    metroNIDAZOLE  (FLAGYL ) 500 MG tablet  3 times daily        03/23/24 2348               Xiong Haidar, MD 03/24/24 0310
